# Patient Record
Sex: Male | Born: 1985 | Race: Black or African American | Hispanic: No | Marital: Single | Smoking: Former smoker
Health system: Southern US, Community
[De-identification: ages and names within clinical notes are randomized; demographics above are authoritative.]

## PROBLEM LIST (undated history)

## (undated) DIAGNOSIS — J45909 Unspecified asthma, uncomplicated: Secondary | ICD-10-CM

## (undated) HISTORY — PX: NO PAST SURGERIES: SHX2092

---

## 2019-02-14 ENCOUNTER — Other Ambulatory Visit: Payer: Self-pay | Admitting: *Deleted

## 2019-02-14 DIAGNOSIS — Z20822 Contact with and (suspected) exposure to covid-19: Secondary | ICD-10-CM

## 2019-02-20 ENCOUNTER — Telehealth: Payer: Self-pay | Admitting: General Practice

## 2019-02-20 LAB — NOVEL CORONAVIRUS, NAA: SARS-CoV-2, NAA: NOT DETECTED

## 2019-02-20 NOTE — Telephone Encounter (Signed)
Pt notified of Covid-19 results. Spoke with pt directly.

## 2019-06-29 ENCOUNTER — Encounter: Payer: Self-pay | Admitting: Emergency Medicine

## 2019-06-29 ENCOUNTER — Emergency Department: Payer: 59

## 2019-06-29 ENCOUNTER — Emergency Department
Admission: EM | Admit: 2019-06-29 | Discharge: 2019-06-30 | Disposition: A | Discharge status: Home / Self Care | Payer: 59 | Attending: Emergency Medicine | Admitting: Emergency Medicine

## 2019-06-29 ENCOUNTER — Other Ambulatory Visit: Payer: Self-pay

## 2019-06-29 DIAGNOSIS — R059 Cough, unspecified: Secondary | ICD-10-CM

## 2019-06-29 DIAGNOSIS — J40 Bronchitis, not specified as acute or chronic: Secondary | ICD-10-CM

## 2019-06-29 DIAGNOSIS — U071 COVID-19: Secondary | ICD-10-CM | POA: Insufficient documentation

## 2019-06-29 DIAGNOSIS — J45909 Unspecified asthma, uncomplicated: Secondary | ICD-10-CM | POA: Insufficient documentation

## 2019-06-29 DIAGNOSIS — R05 Cough: Secondary | ICD-10-CM

## 2019-06-29 DIAGNOSIS — R509 Fever, unspecified: Secondary | ICD-10-CM

## 2019-06-29 DIAGNOSIS — Z87891 Personal history of nicotine dependence: Secondary | ICD-10-CM | POA: Insufficient documentation

## 2019-06-29 DIAGNOSIS — Z20828 Contact with and (suspected) exposure to other viral communicable diseases: Secondary | ICD-10-CM | POA: Insufficient documentation

## 2019-06-29 HISTORY — DX: Unspecified asthma, uncomplicated: J45.909

## 2019-06-29 MED ORDER — BENZONATATE 100 MG PO CAPS: 100.0000 mg | ORAL_CAPSULE | Freq: Three times a day (TID) | ORAL | 0 refills | Status: DC | PRN

## 2019-06-29 MED ORDER — ALBUTEROL SULFATE HFA 108 (90 BASE) MCG/ACT IN AERS: 2.0000 | INHALATION_SPRAY | Freq: Four times a day (QID) | RESPIRATORY_TRACT | 0 refills | Status: DC | PRN

## 2019-06-29 MED ORDER — AZITHROMYCIN 500 MG PO TABS: 500.0000 mg | ORAL_TABLET | Freq: Every day | ORAL | 0 refills | Status: DC

## 2019-06-29 NOTE — ED Provider Notes (Signed)
[facility] Emergency Department Provider Note ________________   First MD Initiated Contact with Patient 06/29/19 2226     (approximate)  I have reviewed the triage vital signs and the nursing notes.   HISTORY  Chief Complaint Fever and Generalized Body Aches   HPI Austin Koch is a 33 y.o. male with history of asthma positive cigarette use daily presents to the emergency department secondary to fever generalized body aches productive cough x2 days.  Patient denies any known sick contact.  Patient denies any loss of taste or smell.  Patient denies any vomiting or diarrhea.       Past Medical History:  Diagnosis Date  . Asthma     There are no active problems to display for this patient.   History reviewed. No pertinent surgical history.  Prior to Admission medications   Not on File    Allergies Patient has no allergy information on record.  No family history on file.  Social History Social History   Tobacco Use  . Smoking status: Former Smoker  Substance Use Topics  . Alcohol use: Yes  . Drug use: Never    Review of Systems Constitutional: Positive for fever/chills Eyes: No visual changes. ENT: No sore throat. Cardiovascular: Denies chest pain. Respiratory: Positive for productive cough Gastrointestinal: No abdominal pain.  No nausea, no vomiting.  No diarrhea.  No constipation. Genitourinary: Negative for dysuria. Musculoskeletal: Negative for neck pain.  Negative for back pain. Integumentary: Negative for rash. Neurological: Negative for headaches, focal weakness or numbness.   ____________________________________________   PHYSICAL EXAM:  VITAL SIGNS: ED Triage Vitals  Enc Vitals Group     BP 06/29/19 2057 130/80     Pulse Rate 06/29/19 2057 95     Resp 06/29/19 2057 20     Temp 06/29/19 2057 99.9 F (37.7 C)     Temp Source 06/29/19 2057 Oral     SpO2 06/29/19 2057 97 %     Weight 06/29/19 2059 99.8 kg  (220 lb)     Height 06/29/19 2059 1.88 m (6\' 2" )     Head Circumference --      Peak Flow --      Pain Score 06/29/19 2059 0     Pain Loc --      Pain Edu? --      Excl. in [place]? --     Constitutional: Alert and oriented.  Eyes: Conjunctivae are normal.  Mouth/Throat: Patient is wearing a mask. Neck: No stridor.  No meningeal signs.   Cardiovascular: Normal rate, regular rhythm. Good peripheral circulation. Grossly normal heart sounds. Respiratory: Normal respiratory effort.  No retractions. Gastrointestinal: Soft and nontender. No distention.  Musculoskeletal: No lower extremity tenderness nor edema. No gross deformities of extremities. Neurologic:  Normal speech and language. No gross focal neurologic deficits are appreciated.  Skin:  Skin is warm, dry and intact. Psychiatric: Mood and affect are normal. Speech and behavior are normal.  ____________________________________________   LABS (all labs ordered are listed, but only abnormal results are displayed)  Labs Reviewed  SARS CORONAVIRUS 2 (TAT 6-24 HRS)  INFLUENZA PANEL BY PCR (TYPE A & B)   ______________ RADIOLOGY I, Aviston N BROWN, personally viewed and evaluated these images (plain radiographs) as part of my medical decision making, as well as reviewing the written report by the radiologist.  ED MD interpretation: No active disease per radiologist on chest x-ray  Official radiology report(s): Dg Chest 1 View  Result Date: 06/29/2019  CLINICAL DATA:  Cough and fever EXAM: CHEST  1 VIEW COMPARISON:  None. FINDINGS: The heart size and mediastinal contours are within normal limits. Both lungs are clear. The visualized skeletal structures are unremarkable. IMPRESSION: No active disease. Electronically Signed   By: Jasmine Pang M.D.   On: 06/29/2019 22:52     Procedures   ____________________________________________   INITIAL IMPRESSION / MDM / ASSESSMENT AND PLAN / ED COURSE  As part of my medical decision  making, I reviewed the following data within the electronic MEDICAL RECORD NUMBER  33 year old male presenting with above-stated history and physical exam concerning for possible COVID-19 infection versus bacterial pneumonia and bronchitis.  Chest x-ray revealed no evidence of pneumonia.  Spoke with the patient at length regarding the fact that COVID-19 testing will not be resulted for the next 6 to 24 hours and as such I advised the patient to self quarantine at home.  ____________________________________________  FINAL CLINICAL IMPRESSION(S) / ED DIAGNOSES  Viral illness.  MEDICATIONS GIVEN DURING THIS VISIT:  Medications - No data to display   ED Discharge Orders    None      *Please note:  [name] was evaluated in Emergency Department on 06/29/2019 for the symptoms described in the history of present illness. He was evaluated in the context of the global COVID-19 pandemic, which necessitated consideration that the patient might be at risk for infection with the SARS-CoV-2 virus that causes COVID-19. Institutional protocols and algorithms that pertain to the evaluation of patients at risk for COVID-19 are in a state of rapid change based on information released by regulatory bodies including the CDC and federal and state organizations. These policies and algorithms were followed during the patient's care in the ED.  Some ED evaluations and interventions may be delayed as a result of limited staffing during the pandemic.*  Note:  This document was prepared using Dragon voice recognition software and may include unintentional dictation errors.   Darci Current, MD 07/02/19 2151

## 2019-06-29 NOTE — ED Triage Notes (Signed)
Pt presents to ER from home with complaints of fever and body aches, productive cough sputum white in color, pt reports took ibuprofen prior to arrival fever at home 100.75F. Pt talks in complete sentences no distress noted

## 2019-06-30 ENCOUNTER — Telehealth: Payer: Self-pay | Admitting: Emergency Medicine

## 2019-06-30 LAB — SARS CORONAVIRUS 2 (TAT 6-24 HRS): SARS Coronavirus 2: POSITIVE — AB

## 2019-06-30 LAB — INFLUENZA PANEL BY PCR (TYPE A & B)
Influenza A By PCR: NEGATIVE
Influenza B By PCR: NEGATIVE

## 2019-06-30 MED ORDER — BENZONATATE 100 MG PO CAPS: 100.0000 mg | ORAL_CAPSULE | Freq: Three times a day (TID) | ORAL | 0 refills | Status: DC | PRN

## 2019-06-30 MED ORDER — AZITHROMYCIN 500 MG PO TABS: 500.0000 mg | ORAL_TABLET | Freq: Every day | ORAL | 0 refills | Status: AC

## 2019-06-30 MED ORDER — ALBUTEROL SULFATE HFA 108 (90 BASE) MCG/ACT IN AERS: 2.0000 | INHALATION_SPRAY | Freq: Four times a day (QID) | RESPIRATORY_TRACT | 0 refills | Status: DC | PRN

## 2019-06-30 NOTE — Telephone Encounter (Signed)
Called to assure patient is aware of positive covid test.  Left message with my  Number and that [facility] website has covid hotline for questions.  He does have mychart.

## 2019-07-01 ENCOUNTER — Telehealth: Payer: Self-pay | Admitting: *Deleted

## 2019-07-01 NOTE — Telephone Encounter (Signed)
Pt given result of COVID; he is having cough, muscle soreness; his fever has lowered; tier of symptoms reviewed; instructions given to pt: Marland Kitchen Instruct the patient to remain in self-quarantine until they meet the "Non-Test Criteria for Ending Self-Isolation". Non-Test Criteria for Ending Self-Isolation All persons with fever and respiratory symptoms should isolate themselves until ALL conditions listed below are met: - at least 10 days since symptoms onset - AND 3 consecutive days fever free without antipyretics (acetaminophen [Tylenol] or ibuprofen [Advil]) - AND improvement in respiratory symptoms . If the patient develops respiratory issues/distress, seek medical care in the Emergency Department, call 911, reports symptoms and report COVID-19 positive test. Patient Instructions . continue to utilize over-the-counter medications for fever (ibuprofen and/or Tylenol) and cough (cough medicine and/or sore throat lozenges). . wear a mask around people and follow good infection prevention techniques. . Patient to should only leave home to seek medical care. .  send family for food, prescriptions or medicines; or use delivery service.  . If the patient must leave the home, they must wear a mask in public. Marland Kitchen limit contact with immediate family members or caregivers in the home, and use mask, social distancing, and handwashing to decrease risk to patients. o Please continue good preventive care measures, including frequent hand washing, avoid touching your face, cover coughs/sneezes with tissue or into elbow, stay out of crowds and keep a 6-foot distance from others.   .  patient and family to clean hard surfaces touched by patient frequently with household cleaning products. Pt advised to notify his PCP; pt also informed the [facility] will be notified; he verbalized understanding; unable to chart in result note because no encounter created.

## 2019-07-05 ENCOUNTER — Encounter (HOSPITAL_COMMUNITY): Payer: Self-pay

## 2019-07-05 ENCOUNTER — Emergency Department: Payer: 59

## 2019-07-05 ENCOUNTER — Emergency Department
Admission: EM | Admit: 2019-07-05 | Discharge: 2019-07-05 | Disposition: A | Discharge status: Home / Self Care | Payer: 59 | Attending: Emergency Medicine | Admitting: Emergency Medicine

## 2019-07-05 ENCOUNTER — Inpatient Hospital Stay (HOSPITAL_COMMUNITY)
Admission: AD | Admit: 2019-07-05 | Discharge: 2019-07-10 | DRG: 177 | Disposition: A | Discharge status: Home / Self Care | Payer: 59 | Source: Other Acute Inpatient Hospital | Attending: Internal Medicine | Admitting: Internal Medicine

## 2019-07-05 ENCOUNTER — Other Ambulatory Visit: Payer: Self-pay

## 2019-07-05 DIAGNOSIS — E1169 Type 2 diabetes mellitus with other specified complication: Secondary | ICD-10-CM

## 2019-07-05 DIAGNOSIS — Z87891 Personal history of nicotine dependence: Secondary | ICD-10-CM | POA: Diagnosis not present

## 2019-07-05 DIAGNOSIS — U071 COVID-19: Secondary | ICD-10-CM | POA: Diagnosis present

## 2019-07-05 DIAGNOSIS — F1290 Cannabis use, unspecified, uncomplicated: Secondary | ICD-10-CM | POA: Diagnosis present

## 2019-07-05 DIAGNOSIS — Z23 Encounter for immunization: Secondary | ICD-10-CM | POA: Diagnosis not present

## 2019-07-05 DIAGNOSIS — J069 Acute upper respiratory infection, unspecified: Secondary | ICD-10-CM | POA: Diagnosis present

## 2019-07-05 DIAGNOSIS — R0603 Acute respiratory distress: Secondary | ICD-10-CM | POA: Diagnosis not present

## 2019-07-05 DIAGNOSIS — F17210 Nicotine dependence, cigarettes, uncomplicated: Secondary | ICD-10-CM | POA: Diagnosis present

## 2019-07-05 DIAGNOSIS — R0602 Shortness of breath: Secondary | ICD-10-CM | POA: Diagnosis present

## 2019-07-05 DIAGNOSIS — J45909 Unspecified asthma, uncomplicated: Secondary | ICD-10-CM | POA: Insufficient documentation

## 2019-07-05 DIAGNOSIS — Z794 Long term (current) use of insulin: Secondary | ICD-10-CM | POA: Diagnosis not present

## 2019-07-05 DIAGNOSIS — R0902 Hypoxemia: Secondary | ICD-10-CM | POA: Insufficient documentation

## 2019-07-05 DIAGNOSIS — Z79899 Other long term (current) drug therapy: Secondary | ICD-10-CM

## 2019-07-05 DIAGNOSIS — J9601 Acute respiratory failure with hypoxia: Secondary | ICD-10-CM | POA: Diagnosis present

## 2019-07-05 DIAGNOSIS — T380X5A Adverse effect of glucocorticoids and synthetic analogues, initial encounter: Secondary | ICD-10-CM | POA: Diagnosis not present

## 2019-07-05 DIAGNOSIS — J1282 Pneumonia due to coronavirus disease 2019: Secondary | ICD-10-CM

## 2019-07-05 DIAGNOSIS — J1289 Other viral pneumonia: Secondary | ICD-10-CM | POA: Diagnosis present

## 2019-07-05 DIAGNOSIS — J45901 Unspecified asthma with (acute) exacerbation: Secondary | ICD-10-CM

## 2019-07-05 DIAGNOSIS — R0600 Dyspnea, unspecified: Secondary | ICD-10-CM

## 2019-07-05 DIAGNOSIS — E1165 Type 2 diabetes mellitus with hyperglycemia: Secondary | ICD-10-CM | POA: Diagnosis present

## 2019-07-05 DIAGNOSIS — D649 Anemia, unspecified: Secondary | ICD-10-CM | POA: Diagnosis present

## 2019-07-05 LAB — SEDIMENTATION RATE: Sed Rate: 70 mm/hr — ABNORMAL HIGH (ref 0–15)

## 2019-07-05 LAB — BASIC METABOLIC PANEL
Anion gap: 14 (ref 5–15)
BUN: 11 mg/dL (ref 6–20)
CO2: 26 mmol/L (ref 22–32)
Calcium: 8.9 mg/dL (ref 8.9–10.3)
Chloride: 95 mmol/L — ABNORMAL LOW (ref 98–111)
Creatinine, Ser: 1.15 mg/dL (ref 0.61–1.24)
GFR calc Af Amer: >60 mL/min (ref >60)
GFR calc non Af Amer: >60 mL/min (ref >60)
Glucose, Bld: 224 mg/dL — ABNORMAL HIGH (ref 70–99)
Potassium: 3.9 mmol/L (ref 3.5–5.1)
Sodium: 135 mmol/L (ref 135–145)

## 2019-07-05 LAB — CBC
HCT: 42.2 % (ref 39.0–52.0)
Hemoglobin: 13.9 g/dL (ref 13.0–17.0)
MCH: 27.7 pg (ref 26.0–34.0)
MCHC: 32.9 g/dL (ref 30.0–36.0)
MCV: 84.1 fL (ref 80.0–100.0)
Platelets: 292 10*3/uL (ref 150–400)
RBC: 5.02 MIL/uL (ref 4.22–5.81)
RDW: 12.5 % (ref 11.5–15.5)
WBC: 7.2 10*3/uL (ref 4.0–10.5)
nRBC: 0 % (ref 0.0–0.2)

## 2019-07-05 LAB — FIBRIN DERIVATIVES D-DIMER (ARMC ONLY): Fibrin derivatives D-dimer (ARMC): 881.13 ng/mL (FEU) — ABNORMAL HIGH (ref 0.00–499.00)

## 2019-07-05 LAB — HEPATIC FUNCTION PANEL
ALT: 18 U/L (ref 0–44)
AST: 33 U/L (ref 15–41)
Albumin: 3.5 g/dL (ref 3.5–5.0)
Alkaline Phosphatase: 72 U/L (ref 38–126)
Bilirubin, Direct: 0.3 mg/dL — ABNORMAL HIGH (ref 0.0–0.2)
Indirect Bilirubin: 1 mg/dL — ABNORMAL HIGH (ref 0.3–0.9)
Total Bilirubin: 1.3 mg/dL — ABNORMAL HIGH (ref 0.3–1.2)
Total Protein: 7.9 g/dL (ref 6.5–8.1)

## 2019-07-05 LAB — C-REACTIVE PROTEIN: CRP: 18.7 mg/dL — ABNORMAL HIGH (ref <1.0)

## 2019-07-05 LAB — FIBRINOGEN: Fibrinogen: >750 mg/dL — ABNORMAL HIGH (ref 210–475)

## 2019-07-05 LAB — FERRITIN: Ferritin: 462 ng/mL — ABNORMAL HIGH (ref 24–336)

## 2019-07-05 MED ORDER — ONDANSETRON HCL 4 MG PO TABS: 4.0000 mg | ORAL_TABLET | Freq: Four times a day (QID) | ORAL | Status: DC | PRN

## 2019-07-05 MED ORDER — DEXAMETHASONE SODIUM PHOSPHATE 10 MG/ML IJ SOLN
10.0000 mg | Freq: Once | INTRAMUSCULAR | Status: AC
  Administered 2019-07-05: 10 mg via INTRAVENOUS
  Filled 2019-07-05: qty 1

## 2019-07-05 MED ORDER — SODIUM CHLORIDE 0.9 % IV SOLN
200.0000 mg | Freq: Once | INTRAVENOUS | Status: AC
  Administered 2019-07-05: 200 mg via INTRAVENOUS
  Filled 2019-07-05: qty 40

## 2019-07-05 MED ORDER — ALBUTEROL SULFATE HFA 108 (90 BASE) MCG/ACT IN AERS
2.0000 | INHALATION_SPRAY | Freq: Once | RESPIRATORY_TRACT | Status: DC
  Filled 2019-07-05: qty 6.7

## 2019-07-05 MED ORDER — HYDROCOD POLST-CPM POLST ER 10-8 MG/5ML PO SUER
5.0000 mL | Freq: Once | ORAL | Status: AC
  Administered 2019-07-05: 5 mL via ORAL
  Filled 2019-07-05: qty 5

## 2019-07-05 MED ORDER — ONDANSETRON HCL 4 MG/2ML IJ SOLN: 4.0000 mg | Freq: Four times a day (QID) | INTRAMUSCULAR | Status: DC | PRN

## 2019-07-05 MED ORDER — ACETAMINOPHEN 325 MG PO TABS
650.0000 mg | ORAL_TABLET | Freq: Four times a day (QID) | ORAL | Status: DC | PRN
  Administered 2019-07-05 – 2019-07-08 (×2): 650 mg via ORAL
  Filled 2019-07-05 (×2): qty 2

## 2019-07-05 MED ORDER — SODIUM CHLORIDE 0.9 % IV SOLN
100.0000 mg | Freq: Every day | INTRAVENOUS | Status: AC
  Administered 2019-07-06 – 2019-07-09 (×4): 100 mg via INTRAVENOUS
  Filled 2019-07-05 (×4): qty 100

## 2019-07-05 MED ORDER — HYDROCODONE-ACETAMINOPHEN 5-325 MG PO TABS
1.0000 | ORAL_TABLET | ORAL | Status: DC | PRN
  Administered 2019-07-07 – 2019-07-08 (×2): 2 via ORAL
  Filled 2019-07-05 (×2): qty 2

## 2019-07-05 MED ORDER — ENOXAPARIN SODIUM 40 MG/0.4ML ~~LOC~~ SOLN
40.0000 mg | SUBCUTANEOUS | Status: DC
  Administered 2019-07-05 – 2019-07-09 (×5): 40 mg via SUBCUTANEOUS
  Filled 2019-07-05 (×5): qty 0.4

## 2019-07-05 MED ORDER — IPRATROPIUM-ALBUTEROL 20-100 MCG/ACT IN AERS
1.0000 | INHALATION_SPRAY | Freq: Four times a day (QID) | RESPIRATORY_TRACT | Status: DC
  Administered 2019-07-05 – 2019-07-10 (×18): 1 via RESPIRATORY_TRACT
  Filled 2019-07-05: qty 4

## 2019-07-05 MED ORDER — TOCILIZUMAB 400 MG/20ML IV SOLN
800.0000 mg | Freq: Once | INTRAVENOUS | Status: AC
  Administered 2019-07-05: 23:00:00 800 mg via INTRAVENOUS
  Filled 2019-07-05: qty 40

## 2019-07-05 MED ORDER — HYDROCOD POLST-CPM POLST ER 10-8 MG/5ML PO SUER
5.0000 mL | Freq: Two times a day (BID) | ORAL | Status: DC | PRN
  Administered 2019-07-06: 5 mL via ORAL
  Filled 2019-07-05: qty 5

## 2019-07-05 MED ORDER — METHYLPREDNISOLONE SODIUM SUCC 40 MG IJ SOLR
40.0000 mg | Freq: Three times a day (TID) | INTRAMUSCULAR | Status: DC
  Administered 2019-07-05 – 2019-07-08 (×9): 40 mg via INTRAVENOUS
  Filled 2019-07-05 (×9): qty 1

## 2019-07-05 MED ORDER — GUAIFENESIN-DM 100-10 MG/5ML PO SYRP
10.0000 mL | ORAL_SOLUTION | ORAL | Status: DC | PRN
  Administered 2019-07-05 – 2019-07-08 (×2): 10 mL via ORAL
  Filled 2019-07-05 (×2): qty 10

## 2019-07-05 NOTE — Progress Notes (Addendum)
Pt was tx here from [facility] for [place]. He has hypoxia + LRTI. Baseline ALT wnl. He will also getting Actemra due to CRP>10.   Remdesivir 200mg  IV x1 then 100mg  IV q24 x4 F/u ALT Check hepatitis titers  Onnie Boer, PharmD, BCIDP, AAHIVP, CPP Infectious Disease Pharmacist 07/05/2019 7:04 PM

## 2019-07-05 NOTE — ED Notes (Signed)
Pt desatted to 89%. Informed Colletta [place], Therapist, sports. Will apply oxygen and request room.

## 2019-07-05 NOTE — ED Triage Notes (Signed)
Pt c/o increased SOB, has had a cough with SOB since last Sunday and was tested positive for covid on Wednesday. States he is not feeling any better and has a hx of asthma.

## 2019-07-05 NOTE — Progress Notes (Signed)
Pt. Arrived at this facility, doctor at bedside. Orders received. Pt. Stable. Will continuo to monitor

## 2019-07-05 NOTE — Progress Notes (Signed)
Patient deferred family update phone call. 

## 2019-07-05 NOTE — H&P (Signed)
History and Physical    Austin Koch [id number] DOB: 05/07/1986 DOA: 07/05/2019  PCP: Patient, No Pcp Per Patient coming [facility]: [facility]  Chief Complaint: Shortness of breath  HPI: Austin Koch is a 33 y.o. male with medical history significant of asthma [facility] for shortness of breath and hypoxia diagnosed with COVID-19.  Patient stated about a week ago he started experiencing fatigue, mild shortness of breath and fever.  He went to get tested for COVID-19 last Monday and was tested positive.  He was self quarantined at home and was being treated with supportive measures.  But over the course the last couple of days he has had more so progressive shortness of breath therefore went [facility].  In the ER he was noted to be hypoxic requiring 2-3 L nasal cannula.  Chest x-ray was suggestive of severe atypical pneumonia.  Inflammatory markers were elevated therefore he was started on Decadron.  [facility].   Review of Systems: As per HPI otherwise 10 point review of systems negative.  Review of Systems Otherwise negative except as per HPI, including: General: Denies night sweats or unintended weight loss. Resp: Denies hemoptysis Cardiac: Denies chest pain, palpitations, orthopnea, paroxysmal nocturnal dyspnea. GI: Denies abdominal pain, nausea, vomiting, diarrhea or constipation GU: Denies dysuria, frequency, hesitancy or incontinence MS: Denies muscle aches, joint pain or swelling Neuro: Denies headache, neurologic deficits (focal weakness, numbness, tingling), abnormal gait Psych: Denies anxiety, depression, SI/HI/AVH Skin: Denies new rashes or lesions ID: Denies sick contacts, exotic exposures, travel  Past Medical History:  Diagnosis Date  . Asthma     No past surgical history on file.  SOCIAL HISTORY: Smokes maybe 1-2 cigarettes/month.  Smokes marijuana very occasionally.  Drinks alcohol  maybe 2-3 times a month.  FAMILY HISTORY: Essential hypertension  Prior to Admission medications   Medication Sig Start Date End Date Taking? Authorizing Provider  albuterol (VENTOLIN HFA) 108 (90 Base) MCG/ACT inhaler Inhale 2 puffs into the lungs every 6 (six) hours as needed for wheezing or shortness of breath. 06/30/19  Yes Triplett, Cari B, FNP  benzonatate (TESSALON PERLES) 100 MG capsule Take 1 capsule (100 mg total) by mouth 3 (three) times daily as needed for cough. 06/30/19 06/29/20 Yes Triplett, Cari B, FNP    Physical Exam: Vitals:   07/05/19 1800  BP: 133/80  Pulse: 85  Resp: (!) 25  Temp: 100.2 F (37.9 C)  TempSrc: Oral  SpO2: (!) 89%      Constitutional: NAD, calm, comfortable, 4 L nasal cannula Eyes: PERRL, lids and conjunctivae normal ENMT: Mucous membranes are moist. Posterior pharynx clear of any exudate or lesions.Normal dentition.  Neck: normal, supple, no masses, no thyromegaly Respiratory: Scattered rhonchi Cardiovascular: Regular rate and rhythm, no murmurs / rubs / gallops. No extremity edema. 2+ pedal pulses. No carotid bruits.  Abdomen: no tenderness, no masses palpated. No hepatosplenomegaly. Bowel sounds positive.  Musculoskeletal: no clubbing / cyanosis. No joint deformity upper and lower extremities. Good ROM, no contractures. Normal muscle tone.  Skin: no rashes, lesions, ulcers. No induration Neurologic: CN 2-12 grossly intact. Sensation intact, DTR normal. Strength 5/5 in all 4.  Psychiatric: Normal judgment and insight. Alert and oriented x 3. Normal mood.     Labs on Admission: I have personally reviewed following labs and imaging studies  CBC: Recent Labs  Lab 07/05/19 1143  WBC 7.2  HGB 13.9  HCT 42.2  MCV 84.1  PLT 696   Basic Metabolic Panel: Recent Labs  Lab 07/05/19 1143  NA 135  K 3.9  CL 95*  CO2 26  GLUCOSE 224*  BUN 11  CREATININE 1.15  CALCIUM 8.9   GFR: Estimated Creatinine Clearance: 116.3 mL/min (by  C-G formula based on SCr of 1.15 mg/dL). Liver Function Tests: Recent Labs  Lab 07/05/19 1143  AST 33  ALT 18  ALKPHOS 72  BILITOT 1.3*  PROT 7.9  ALBUMIN 3.5   No results for input(s): LIPASE, AMYLASE in the last 168 hours. No results for input(s): AMMONIA in the last 168 hours. Coagulation Profile: No results for input(s): INR, PROTIME in the last 168 hours. Cardiac Enzymes: No results for input(s): CKTOTAL, CKMB, CKMBINDEX, TROPONINI in the last 168 hours. BNP (last 3 results) No results for input(s): PROBNP in the last 8760 hours. HbA1C: No results for input(s): HGBA1C in the last 72 hours. CBG: No results for input(s): GLUCAP in the last 168 hours. Lipid Profile: No results for input(s): CHOL, HDL, LDLCALC, TRIG, CHOLHDL, LDLDIRECT in the last 72 hours. Thyroid Function Tests: No results for input(s): TSH, T4TOTAL, FREET4, T3FREE, THYROIDAB in the last 72 hours. Anemia Panel: Recent Labs    07/05/19 1143  FERRITIN 462*   Urine analysis: No results found for: COLORURINE, APPEARANCEUR, LABSPEC, PHURINE, GLUCOSEU, HGBUR, BILIRUBINUR, KETONESUR, PROTEINUR, UROBILINOGEN, NITRITE, LEUKOCYTESUR Sepsis Labs: !!!!!!!!!!!!!!!!!!!!!!!!!!!!!!!!!!!!!!!!!!!! @LABRCNTIP (procalcitonin:4,lacticidven:4) ) Recent Results (from the past 240 hour(s))  SARS CORONAVIRUS 2 (TAT 6-24 HRS) Nasopharyngeal Nasopharyngeal Swab     Status: Abnormal   Collection Time: 06/29/19 10:50 PM   Specimen: Nasopharyngeal Swab  Result Value Ref Range Status   SARS Coronavirus 2 POSITIVE (A) NEGATIVE Final    Comment: RESULT CALLED TO, READ BACK BY AND VERIFIED WITH: Torrie Mayers RN 15:05 06/30/19 ([email or web address]) (NOTE) SARS-CoV-2 target nucleic acids are DETECTED. The SARS-CoV-2 RNA is generally detectable in upper and lower respiratory specimens during the acute phase of infection. Positive results are indicative of active infection with SARS-CoV-2. Clinical  correlation with patient history and other  diagnostic information is necessary to determine patient infection status. Positive results do  not rule out bacterial infection or co-infection with other viruses. The expected result is Negative. Fact Sheet for Patients: SugarRoll.be Fact Sheet for Healthcare Providers: https://www.woods-mathews.com/ This test is not yet approved or cleared by the [place] FDA and  has been authorized for detection and/or diagnosis of SARS-CoV-2 by FDA under an Emergency Use Authorization (EUA). This EUA will remain  in effect (meaning this test can be used) for  the duration of the COVID-19 declaration under Section 564(b)(1) of the Act, 21 U.S.C. section 360bbb-3(b)(1), unless the authorization is terminated or revoked sooner. Performed at [facility], Start [address], [place], [place] [address]      Radiological Exams on Admission: Dg Chest Portable 1 View  Result Date: 07/05/2019 CLINICAL DATA:  Shortness of breath, COVID positive. EXAM: PORTABLE CHEST 1 VIEW COMPARISON:  06/29/2019 FINDINGS: Lung volumes are diminished compared to prior study. Cardiomediastinal contours are normal and airways are patent. Increased interstitial and airspace opacities throughout both the left and right chest. This is a diffuse process but with relative sparing of left upper lobe. No acute bone finding. IMPRESSION: Findings that could be seen in a severe atypical or viral pneumonia. Electronically Signed   By: Zetta Bills M.D.   On: 07/05/2019 11:31     All images have been reviewed by me personally.    Assessment/Plan Principal Problem:  Acute respiratory disease due to COVID-19 virus Active Problems:   Asthma, chronic, unspecified asthma severity, with acute exacerbation    Acute respiratory distress secondary to COVID-19 Acute hypoxia, 82% on room air -Oxygen levels-currently on 3 L nasal cannula saturating greater than 90% -Remdesivir-day 1 -IV  Solu-Medrol 40 mg every 8 -Routine: Labs have been reviewed including ferritin, LDH, CRP, d-dimer, fibrinogen.  Will need to trend this lab daily.  CRP significantly elevated -Vitamin C & Zinc. Prone >16hrs/day.  -Procalcitonin and BNP ordered -Chest x-ray-suggestive of severe atypical pneumonia/infiltrate -Supportive care-antitussive, inhalers, I-S/flutter -CODE STATUS confirmed  Had extensive discussion with the patient regarding off label use of Actemra and emergent use of convalescent plasma given worsening of shortness of breath, oxygen requirement from hypoxia and elevation in inflammatory markers.  Denies any active/latent tuberculosis, hepatitis infection, active immunosuppressive state including chemotherapy.  No severe sepsis.  Patient and family understood and all the questions answered.  At this time patient agrees to proceed with Actemra but would like to hold off on plasma.  I have given him reading information for convalescent plasma and he will decide.  He understands that these medicines are most important during early in disease course.  Asthma, stable -Bronchodilators, incentive spirometer and flutter valve.  Supportive care.   DVT prophylaxis: Lovenox Code Status: Full Family Communication: None Disposition Plan: To be determined Consults called: None Admission status: Inpatient admission for hypoxia secondary to acute respiratory distress from COVID-19.   Time Spent: 65 minutes.  >50% of the time was devoted to discussing the patients care, assessment, plan and disposition with other care givers along with counseling the patient about the risks and benefits of treatment.    Shelonda Saxe Joline Maxcy MD [facility]  If 7PM-7AM, please contact night-coverage   07/05/2019, 6:44 PM

## 2019-07-05 NOTE — ED Provider Notes (Signed)
[facility] Emergency Department Provider Note  Time seen: 10:56 AM  I have reviewed the triage vital signs and the nursing notes.   HISTORY  Chief Complaint URI   HPI Austin Koch is a 33 y.o. male with a past medical history of asthma who presents to the emergency department for trouble breathing.  According to the patient for 1 week now he has had cough with fever at home, he tested positive for Covid on Wednesday.  Patient states he has continued to have cough and shortness of breath.  States he is no better today so he came to the emergency department for evaluation.  Patient states he has had intermittent fevers up until today denies any fever today, reassuring vitals with a borderline lower O2 saturation currently 92% on room air.  Patient is asthmatic uses albuterol at home.  This would be day 7-8 of symptoms.   Past Medical History:  Diagnosis Date  . Asthma     There are no active problems to display for this patient.   History reviewed. No pertinent surgical history.  Prior to Admission medications   Medication Sig Start Date End Date Taking? Authorizing Provider  albuterol (VENTOLIN HFA) 108 (90 Base) MCG/ACT inhaler Inhale 2 puffs into the lungs every 6 (six) hours as needed for wheezing or shortness of breath. 06/30/19   Triplett, Cari B, FNP  benzonatate (TESSALON PERLES) 100 MG capsule Take 1 capsule (100 mg total) by mouth 3 (three) times daily as needed for cough. 06/30/19 06/29/20  Triplett, Rulon Eisenmenger B, FNP    No Known Allergies  No family history on file.  Social History Social History   Tobacco Use  . Smoking status: Former Games developer  . Smokeless tobacco: Never Used  Substance Use Topics  . Alcohol use: Yes  . Drug use: Never    Review of Systems Constitutional: Intermittent fevers until yesterday, no fever since Cardiovascular: Mild chest discomfort worse with coughing Respiratory: Positive for shortness of breath.  Positive  for cough. Gastrointestinal: Negative for abdominal pain, vomiting Musculoskeletal: Negative for musculoskeletal complaints Neurological: Negative for headache All other ROS negative  ____________________________________________   PHYSICAL EXAM:  VITAL SIGNS: ED Triage Vitals  Enc Vitals Group     BP 07/05/19 1013 107/60     Pulse Rate 07/05/19 1013 81     Resp 07/05/19 1013 18     Temp 07/05/19 1013 98.7 F (37.1 C)     Temp Source 07/05/19 1013 Oral     SpO2 07/05/19 1013 94 %     Weight 07/05/19 1012 220 lb (99.8 kg)     Height 07/05/19 1012 6\' 2"  (1.88 m)     Head Circumference --      Peak Flow --      Pain Score 07/05/19 1012 0     Pain Loc --      Pain Edu? --      Excl. in [place]? --    Constitutional: Alert and oriented. Well appearing and in no distress. Eyes: Normal exam ENT      Head: Normocephalic and atraumatic.      Mouth/Throat: Mucous membranes are moist. Cardiovascular: Normal rate, regular rhythm. Respiratory: Normal respiratory effort mild rhonchi bilaterally no obvious wheeze. Gastrointestinal: Soft and nontender. No distention. Musculoskeletal: Nontender with normal range of motion in all extremities.  Neurologic:  Normal speech and language. No gross focal neurologic deficits  Skin:  Skin is warm, dry and intact.  Psychiatric: Mood and affect  are normal.   ____________________________________________   RADIOLOGY  Chest x-ray consistent with severe atypical infection  ____________________________________________   INITIAL IMPRESSION / ASSESSMENT AND PLAN / ED COURSE  Pertinent labs & imaging results that were available during my care of the patient were reviewed by me and considered in my medical decision making (see chart for details).   Patient presents emergency department for shortness of breath cough, Covid positive as of Wednesday symptoms x8 days.  We will obtain a chest x-ray, dose albuterol, Tussionex and continue to closely monitor.   Differential would include worsening Covid symptoms, pneumonia.  Patient D satting into the 80s as low as 85% on room air, for the most part stays between 87 and 90%.  I reviewed the patient's chest x-ray.  Show fairly significant bilateral patchy opacities consistent with COVID-19.  We will check labs including inflammatory markers treat with Decadron.  Given the patient's oxygen requirement we will discuss with [facility] for possible admission/transfer.  Patient accepted to [place] bed.  Patient agreeable to plan of care.  Austin Koch was evaluated in Emergency Department on 07/05/2019 for the symptoms described in the history of present illness. He was evaluated in the context of the global COVID-19 pandemic, which necessitated consideration that the patient might be at risk for infection with the SARS-CoV-2 virus that causes COVID-19. Institutional protocols and algorithms that pertain to the evaluation of patients at risk for COVID-19 are in a state of rapid change based on information released by regulatory bodies including the CDC and federal and state organizations. These policies and algorithms were followed during the patient's care in the ED.  ____________________________________________   FINAL CLINICAL IMPRESSION(S) / ED DIAGNOSES  Dyspnea COVID-19   Harvest Dark, MD 07/05/19 1313

## 2019-07-05 NOTE — ED Notes (Signed)
Pt's sat leveled dropped to 88% on room air. Pt placed on 2 L with sat increase to 94%.

## 2019-07-05 NOTE — ED Notes (Addendum)
Pt verbalized understanding of need for transport to [facility] for treatment. Signature pad not working at time of discharge pt is in agreement to transfer and has given verbal consent. NAD at this time.

## 2019-07-06 ENCOUNTER — Inpatient Hospital Stay (HOSPITAL_COMMUNITY): Payer: 59

## 2019-07-06 LAB — COMPREHENSIVE METABOLIC PANEL
ALT: 21 U/L (ref 0–44)
AST: 30 U/L (ref 15–41)
Albumin: 3.3 g/dL — ABNORMAL LOW (ref 3.5–5.0)
Alkaline Phosphatase: 79 U/L (ref 38–126)
Anion gap: 14 (ref 5–15)
BUN: 18 mg/dL (ref 6–20)
CO2: 23 mmol/L (ref 22–32)
Calcium: 8.8 mg/dL — ABNORMAL LOW (ref 8.9–10.3)
Chloride: 97 mmol/L — ABNORMAL LOW (ref 98–111)
Creatinine, Ser: 1.19 mg/dL (ref 0.61–1.24)
GFR calc Af Amer: >60 mL/min (ref >60)
GFR calc non Af Amer: >60 mL/min (ref >60)
Glucose, Bld: 348 mg/dL — ABNORMAL HIGH (ref 70–99)
Potassium: 4.5 mmol/L (ref 3.5–5.1)
Sodium: 134 mmol/L — ABNORMAL LOW (ref 135–145)
Total Bilirubin: 0.6 mg/dL (ref 0.3–1.2)
Total Protein: 7.7 g/dL (ref 6.5–8.1)

## 2019-07-06 LAB — CBC WITH DIFFERENTIAL/PLATELET
Abs Immature Granulocytes: 0.06 10*3/uL (ref 0.00–0.07)
Basophils Absolute: 0 10*3/uL (ref 0.0–0.1)
Basophils Relative: 0 %
Eosinophils Absolute: 0 10*3/uL (ref 0.0–0.5)
Eosinophils Relative: 0 %
HCT: 42.5 % (ref 39.0–52.0)
Hemoglobin: 13.7 g/dL (ref 13.0–17.0)
Immature Granulocytes: 1 %
Lymphocytes Relative: 10 %
Lymphs Abs: 0.6 10*3/uL — ABNORMAL LOW (ref 0.7–4.0)
MCH: 28 pg (ref 26.0–34.0)
MCHC: 32.2 g/dL (ref 30.0–36.0)
MCV: 86.7 fL (ref 80.0–100.0)
Monocytes Absolute: 0.1 10*3/uL (ref 0.1–1.0)
Monocytes Relative: 2 %
Neutro Abs: 5.3 10*3/uL (ref 1.7–7.7)
Neutrophils Relative %: 87 %
Platelets: 318 10*3/uL (ref 150–400)
RBC: 4.9 MIL/uL (ref 4.22–5.81)
RDW: 12.6 % (ref 11.5–15.5)
WBC: 6.1 10*3/uL (ref 4.0–10.5)
nRBC: 0 % (ref 0.0–0.2)

## 2019-07-06 LAB — HEMOGLOBIN A1C
Hgb A1c MFr Bld: 11.8 % — ABNORMAL HIGH (ref 4.8–5.6)
Mean Plasma Glucose: 291.96 mg/dL

## 2019-07-06 LAB — FERRITIN: Ferritin: 522 ng/mL — ABNORMAL HIGH (ref 24–336)

## 2019-07-06 LAB — GLUCOSE, CAPILLARY
Glucose-Capillary: 369 mg/dL — ABNORMAL HIGH (ref 70–99)
Glucose-Capillary: 360 mg/dL — ABNORMAL HIGH (ref 70–99)
Glucose-Capillary: 320 mg/dL — ABNORMAL HIGH (ref 70–99)

## 2019-07-06 LAB — C-REACTIVE PROTEIN: CRP: 17.8 mg/dL — ABNORMAL HIGH (ref <1.0)

## 2019-07-06 LAB — PHOSPHORUS: Phosphorus: 2.9 mg/dL (ref 2.5–4.6)

## 2019-07-06 LAB — ABO/RH: ABO/RH(D): O POS

## 2019-07-06 LAB — MAGNESIUM: Magnesium: 2.2 mg/dL (ref 1.7–2.4)

## 2019-07-06 LAB — D-DIMER, QUANTITATIVE: D-Dimer, Quant: 0.84 ug/mL-FEU — ABNORMAL HIGH (ref 0.00–0.50)

## 2019-07-06 MED ORDER — ZINC SULFATE 220 (50 ZN) MG PO CAPS
220.0000 mg | ORAL_CAPSULE | Freq: Every day | ORAL | Status: DC
  Administered 2019-07-06 – 2019-07-10 (×5): 220 mg via ORAL
  Filled 2019-07-06 (×5): qty 1

## 2019-07-06 MED ORDER — FUROSEMIDE 10 MG/ML IJ SOLN
40.0000 mg | Freq: Once | INTRAMUSCULAR | Status: AC
  Administered 2019-07-06: 12:00:00 40 mg via INTRAVENOUS
  Filled 2019-07-06: qty 4

## 2019-07-06 MED ORDER — INSULIN GLARGINE 100 UNIT/ML ~~LOC~~ SOLN
10.0000 [IU] | Freq: Two times a day (BID) | SUBCUTANEOUS | Status: DC
  Administered 2019-07-06 – 2019-07-07 (×2): 10 [IU] via SUBCUTANEOUS
  Filled 2019-07-06 (×3): qty 0.1

## 2019-07-06 MED ORDER — INSULIN ASPART 100 UNIT/ML ~~LOC~~ SOLN
0.0000 [IU] | Freq: Every day | SUBCUTANEOUS | Status: DC
  Administered 2019-07-06: 21:00:00 4 [IU] via SUBCUTANEOUS
  Administered 2019-07-07 – 2019-07-08 (×2): 3 [IU] via SUBCUTANEOUS
  Administered 2019-07-09: 21:00:00 2 [IU] via SUBCUTANEOUS

## 2019-07-06 MED ORDER — VITAMIN C 500 MG PO TABS
500.0000 mg | ORAL_TABLET | Freq: Every day | ORAL | Status: DC
  Administered 2019-07-06 – 2019-07-10 (×5): 500 mg via ORAL
  Filled 2019-07-06 (×5): qty 1

## 2019-07-06 MED ORDER — INSULIN ASPART 100 UNIT/ML ~~LOC~~ SOLN
0.0000 [IU] | Freq: Three times a day (TID) | SUBCUTANEOUS | Status: DC
  Administered 2019-07-06 – 2019-07-07 (×4): 15 [IU] via SUBCUTANEOUS
  Administered 2019-07-07: 8 [IU] via SUBCUTANEOUS
  Administered 2019-07-08 (×2): 11 [IU] via SUBCUTANEOUS
  Administered 2019-07-08: 8 [IU] via SUBCUTANEOUS
  Administered 2019-07-09: 2 [IU] via SUBCUTANEOUS
  Administered 2019-07-09: 12:00:00 5 [IU] via SUBCUTANEOUS
  Administered 2019-07-09: 09:00:00 8 [IU] via SUBCUTANEOUS
  Administered 2019-07-10: 08:00:00 3 [IU] via SUBCUTANEOUS
  Administered 2019-07-10: 13:00:00 2 [IU] via SUBCUTANEOUS

## 2019-07-06 MED ORDER — SODIUM CHLORIDE 0.9% IV SOLUTION
Freq: Once | INTRAVENOUS | Status: AC
  Administered 2019-07-06: 15:00:00 via INTRAVENOUS

## 2019-07-06 NOTE — Progress Notes (Signed)
Inpatient Diabetes Program Recommendations  AACE/ADA: New Consensus Statement on Inpatient Glycemic Control (2015)  Target Ranges:  Prepandial:   less than 140 mg/dL      Peak postprandial:   less than 180 mg/dL (1-2 hours)      Critically ill patients:  140 - 180 mg/dL   Lab Results  Component Value Date   GLUCAP 369 (H) 07/06/2019   HGBA1C 11.8 (H) 07/06/2019    Review of Glycemic Control Results for Austin Koch, Austin Koch (MRN [id number]) as of 07/06/2019 15:52  Ref. Range 07/06/2019 11:46  Glucose-Capillary Latest Ref Range: 70 - 99 mg/dL 369 (H)   Diabetes history: New onset? Outpatient Diabetes medications: none Current orders for Inpatient glycemic control: Novolog 0-15 units TID, Novolog 0-5 units QHS Solumedrol 40 mg Q8H  Inpatient Diabetes Program Recommendations:    Consider Levemir 10 units BID and changing diet to carb modified.   Noted A1C of 11.8% indicating new diabetes diagnosis per ADA guidelines.  Once noted in MD note, will begin education.   Thanks, Bronson Curb, MSN, RNC-OB Diabetes Coordinator [phone] (8a-5p)

## 2019-07-06 NOTE — Progress Notes (Signed)
Patient requested Austin Koch  [phone] be called for update. Update provided.

## 2019-07-06 NOTE — Progress Notes (Signed)
PROGRESS NOTE    Austin Koch  BJY:[id number] DOB: 11/20/1985 DOA: 07/05/2019 PCP: Patient, No Pcp Per   Brief Narrative:   33 y.o. male with medical history significant of asthma [facility] for shortness of breath and hypoxia diagnosed with COVID-19.  Started on steroids, remdesivir.  Due to elevated CRP, Actemra given.  Plasma given as well.   Assessment & Plan:   Principal Problem:   Acute respiratory disease due to COVID-19 virus Active Problems:   Asthma, chronic, unspecified asthma severity, with acute exacerbation   Acute respiratory distress secondary to COVID-19 -Oxygen levels-due to worsening of hypoxia, now on 14 L high flow -Remdesivir-day 2 -continues 40 mg Solu-Medrol every 8 hours -Daily inflammatory markers -Vitamin C & Zinc. Prone >16hrs/day.  -Procalcitonin and BNP ordered -Chest x-ray-suggestive of severe atypical pneumonia/infiltrate -Supportive care-antitussive, inhalers, I-S/flutter -CODE STATUS confirmed -Patient agreed for plasma today therefore will administer.  Consent signed in the chart -IV Lasix 40 mg  Had extensive discussion with the patient regarding off label use of Actemra and emergent use of convalescent plasma given worsening of shortness of breath, oxygen requirement from hypoxia and elevation in inflammatory markers.  Denies any active/latent tuberculosis, hepatitis infection, active immunosuppressive state including chemotherapy.  No severe sepsis.  Patient and family understood and all the questions answered.  Agreed to proceed with it.  Steroid-induced hyperglycemia -Insulin sliding scale and Accu-Cheks.  Asthma, stable -Bronchodilators, incentive spirometer and flutter valve.  Supportive care.    DVT prophylaxis: Lovenox Code Status: Full Family Communication: None Disposition Plan: Maintain hospital stay due to significant amount of hypoxia now on high flow.   Subjective: Feels okay but does have significant  amount of exertional dyspnea. Overnight his oxygen had to be increased as high as 14 L high flow  Review of Systems Otherwise negative except as per HPI, including: General: Denies fever, chills, night sweats or unintended weight loss. Resp: Denies hemoptysis Cardiac: Denies chest pain, palpitations, orthopnea, paroxysmal nocturnal dyspnea. GI: Denies abdominal pain, nausea, vomiting, diarrhea or constipation GU: Denies dysuria, frequency, hesitancy or incontinence MS: Denies muscle aches, joint pain or swelling Neuro: Denies headache, neurologic deficits (focal weakness, numbness, tingling), abnormal gait Psych: Denies anxiety, depression, SI/HI/AVH Skin: Denies new rashes or lesions ID: Denies sick contacts, exotic exposures, travel  Objective: Vitals:   07/06/19 0356 07/06/19 0540 07/06/19 0832 07/06/19 0927  BP:  109/69 118/78   Pulse: 72 68 80 82  Resp: (!) 22 (!) 26 (!) 25 (!) 24  Temp:  98.4 F (36.9 C)  97.9 F (36.6 C)  TempSrc:  Oral  Oral  SpO2: 91% 91% (!) 88% 90%  Weight:      Height:        Intake/Output Summary (Last 24 hours) at 07/06/2019 1029 Last data filed at 07/06/2019 0300 Gross per 24 hour  Intake 400 ml  Output 1000 ml  Net -600 ml   Filed Weights   07/05/19 1851  Weight: 99.8 kg    Examination:  General exam: Appears calm and comfortable, 14 L high flow Respiratory system: Diffuse bilateral rhonchi Cardiovascular system: S1 & S2 heard, RRR. No JVD, murmurs, rubs, gallops or clicks. No pedal edema. Gastrointestinal system: Abdomen is nondistended, soft and nontender. No organomegaly or masses felt. Normal bowel sounds heard. Central nervous system: Alert and oriented. No focal neurological deficits. Extremities: Symmetric 5 x 5 power. Skin: No rashes, lesions or ulcers Psychiatry: Judgement and insight appear normal. Mood & affect appropriate.  Data Reviewed:   CBC: Recent Labs  Lab 07/05/19 1143 07/06/19 0400  WBC 7.2 6.1   NEUTROABS  --  5.3  HGB 13.9 13.7  HCT 42.2 42.5  MCV 84.1 86.7  PLT 292 829   Basic Metabolic Panel: Recent Labs  Lab 07/05/19 1143 07/06/19 0400  NA 135 134*  K 3.9 4.5  CL 95* 97*  CO2 26 23  GLUCOSE 224* 348*  BUN 11 18  CREATININE 1.15 1.19  CALCIUM 8.9 8.8*  MG  --  2.2  PHOS  --  2.9   GFR: Estimated Creatinine Clearance: 112.4 mL/min (by C-G formula based on SCr of 1.19 mg/dL). Liver Function Tests: Recent Labs  Lab 07/05/19 1143 07/06/19 0400  AST 33 30  ALT 18 21  ALKPHOS 72 79  BILITOT 1.3* 0.6  PROT 7.9 7.7  ALBUMIN 3.5 3.3*   No results for input(s): LIPASE, AMYLASE in the last 168 hours. No results for input(s): AMMONIA in the last 168 hours. Coagulation Profile: No results for input(s): INR, PROTIME in the last 168 hours. Cardiac Enzymes: No results for input(s): CKTOTAL, CKMB, CKMBINDEX, TROPONINI in the last 168 hours. BNP (last 3 results) No results for input(s): PROBNP in the last 8760 hours. HbA1C: No results for input(s): HGBA1C in the last 72 hours. CBG: No results for input(s): GLUCAP in the last 168 hours. Lipid Profile: No results for input(s): CHOL, HDL, LDLCALC, TRIG, CHOLHDL, LDLDIRECT in the last 72 hours. Thyroid Function Tests: No results for input(s): TSH, T4TOTAL, FREET4, T3FREE, THYROIDAB in the last 72 hours. Anemia Panel: Recent Labs    07/05/19 1143 07/06/19 0400  FERRITIN 462* 522*   Sepsis Labs: No results for input(s): PROCALCITON, LATICACIDVEN in the last 168 hours.  Recent Results (from the past 240 hour(s))  SARS CORONAVIRUS 2 (TAT 6-24 HRS) Nasopharyngeal Nasopharyngeal Swab     Status: Abnormal   Collection Time: 06/29/19 10:50 PM   Specimen: Nasopharyngeal Swab  Result Value Ref Range Status   SARS Coronavirus 2 POSITIVE (A) NEGATIVE Final    Comment: RESULT CALLED TO, READ BACK BY AND VERIFIED WITH: Torrie Mayers RN 15:05 06/30/19 ([email or web address]) (NOTE) SARS-CoV-2 target nucleic acids are DETECTED. The  SARS-CoV-2 RNA is generally detectable in upper and lower respiratory specimens during the acute phase of infection. Positive results are indicative of active infection with SARS-CoV-2. Clinical  correlation with patient history and other diagnostic information is necessary to determine patient infection status. Positive results do  not rule out bacterial infection or co-infection with other viruses. The expected result is Negative. Fact Sheet for Patients: SugarRoll.be Fact Sheet for Healthcare Providers: https://www.woods-mathews.com/ This test is not yet approved or cleared by the [place] FDA and  has been authorized for detection and/or diagnosis of SARS-CoV-2 by FDA under an Emergency Use Authorization (EUA). This EUA will remain  in effect (meaning this test can be used) for  the duration of the COVID-19 declaration under Section 564(b)(1) of the Act, 21 U.S.C. section 360bbb-3(b)(1), unless the authorization is terminated or revoked sooner. Performed at [facility], [place] [address], [place], [place] [address]          Radiology Studies: Dg Chest [place] 1 View  Result Date: 07/06/2019 CLINICAL DATA:  33 year old male with a history of dyspnea EXAM: PORTABLE CHEST 1 VIEW COMPARISON:  07/05/2019 FINDINGS: Cardiomediastinal silhouette unchanged in size and contour. Low lung volumes persist. Persistent reticulonodular opacities of the lungs with improvement in aeration compared to the prior  plain film. No pneumothorax or pleural effusion. IMPRESSION: Persisting bilateral reticulonodular opacities, with improved aeration compared to the prior plain film. Electronically Signed   By: Gilmer Mor D.O.   On: 07/06/2019 08:31   Dg Chest Portable 1 View  Result Date: 07/05/2019 CLINICAL DATA:  Shortness of breath, COVID positive. EXAM: PORTABLE CHEST 1 VIEW COMPARISON:  06/29/2019 FINDINGS: Lung volumes are diminished compared to  prior study. Cardiomediastinal contours are normal and airways are patent. Increased interstitial and airspace opacities throughout both the left and right chest. This is a diffuse process but with relative sparing of left upper lobe. No acute bone finding. IMPRESSION: Findings that could be seen in a severe atypical or viral pneumonia. Electronically Signed   By: Donzetta Kohut M.D.   On: 07/05/2019 11:31        Scheduled Meds: . sodium chloride   Intravenous Once  . enoxaparin (LOVENOX) injection  40 mg Subcutaneous Q24H  . insulin aspart  0-15 Units Subcutaneous TID WC  . insulin aspart  0-5 Units Subcutaneous QHS  . Ipratropium-Albuterol  1 puff Inhalation Q6H  . methylPREDNISolone (SOLU-MEDROL) injection  40 mg Intravenous Q8H   Continuous Infusions: . remdesivir 100 mg in NS 250 mL       LOS: 1 day   Time spent= 40 mins    Miana Politte Joline Maxcy, MD [facility]  If 7PM-7AM, please contact night-coverage  07/06/2019, 10:29 AM

## 2019-07-07 LAB — BPAM FFP
Blood Product Expiration Date: 202011171336
ISSUE DATE / TIME: 202011161407
Unit Type and Rh: 5100

## 2019-07-07 LAB — PHOSPHORUS: Phosphorus: 4 mg/dL (ref 2.5–4.6)

## 2019-07-07 LAB — CBC WITH DIFFERENTIAL/PLATELET
Abs Immature Granulocytes: 0.09 10*3/uL — ABNORMAL HIGH (ref 0.00–0.07)
Basophils Absolute: 0 10*3/uL (ref 0.0–0.1)
Basophils Relative: 0 %
Eosinophils Absolute: 0 10*3/uL (ref 0.0–0.5)
Eosinophils Relative: 0 %
HCT: 44.2 % (ref 39.0–52.0)
Hemoglobin: 14.2 g/dL (ref 13.0–17.0)
Immature Granulocytes: 1 %
Lymphocytes Relative: 9 %
Lymphs Abs: 1 10*3/uL (ref 0.7–4.0)
MCH: 27.9 pg (ref 26.0–34.0)
MCHC: 32.1 g/dL (ref 30.0–36.0)
MCV: 86.8 fL (ref 80.0–100.0)
Monocytes Absolute: 0.4 10*3/uL (ref 0.1–1.0)
Monocytes Relative: 3 %
Neutro Abs: 9.8 10*3/uL — ABNORMAL HIGH (ref 1.7–7.7)
Neutrophils Relative %: 87 %
Platelets: 413 10*3/uL — ABNORMAL HIGH (ref 150–400)
RBC: 5.09 MIL/uL (ref 4.22–5.81)
RDW: 12.6 % (ref 11.5–15.5)
WBC: 11.2 10*3/uL — ABNORMAL HIGH (ref 4.0–10.5)
nRBC: 0 % (ref 0.0–0.2)

## 2019-07-07 LAB — GLUCOSE, CAPILLARY
Glucose-Capillary: 352 mg/dL — ABNORMAL HIGH (ref 70–99)
Glucose-Capillary: 385 mg/dL — ABNORMAL HIGH (ref 70–99)
Glucose-Capillary: 268 mg/dL — ABNORMAL HIGH (ref 70–99)
Glucose-Capillary: 290 mg/dL — ABNORMAL HIGH (ref 70–99)

## 2019-07-07 LAB — COMPREHENSIVE METABOLIC PANEL
ALT: 19 U/L (ref 0–44)
AST: 27 U/L (ref 15–41)
Albumin: 3.3 g/dL — ABNORMAL LOW (ref 3.5–5.0)
Alkaline Phosphatase: 77 U/L (ref 38–126)
Anion gap: 13 (ref 5–15)
BUN: 29 mg/dL — ABNORMAL HIGH (ref 6–20)
CO2: 26 mmol/L (ref 22–32)
Calcium: 9 mg/dL (ref 8.9–10.3)
Chloride: 98 mmol/L (ref 98–111)
Creatinine, Ser: 1.21 mg/dL (ref 0.61–1.24)
GFR calc Af Amer: >60 mL/min (ref >60)
GFR calc non Af Amer: >60 mL/min (ref >60)
Glucose, Bld: 330 mg/dL — ABNORMAL HIGH (ref 70–99)
Potassium: 4.9 mmol/L (ref 3.5–5.1)
Sodium: 137 mmol/L (ref 135–145)
Total Bilirubin: 0.6 mg/dL (ref 0.3–1.2)
Total Protein: 7.8 g/dL (ref 6.5–8.1)

## 2019-07-07 LAB — FERRITIN: Ferritin: 639 ng/mL — ABNORMAL HIGH (ref 24–336)

## 2019-07-07 LAB — MAGNESIUM: Magnesium: 2.5 mg/dL — ABNORMAL HIGH (ref 1.7–2.4)

## 2019-07-07 LAB — PREPARE FRESH FROZEN PLASMA: Unit division: 0

## 2019-07-07 LAB — HEPATITIS B SURFACE ANTIGEN: Hepatitis B Surface Ag: NEGATIVE — AB

## 2019-07-07 LAB — BRAIN NATRIURETIC PEPTIDE: B Natriuretic Peptide: 56.2 pg/mL (ref 0.0–100.0)

## 2019-07-07 LAB — C-REACTIVE PROTEIN: CRP: 6.8 mg/dL — ABNORMAL HIGH (ref <1.0)

## 2019-07-07 LAB — D-DIMER, QUANTITATIVE: D-Dimer, Quant: 0.92 ug/mL-FEU — ABNORMAL HIGH (ref 0.00–0.50)

## 2019-07-07 LAB — HEPATITIS B SURFACE ANTIBODY, QUANTITATIVE: Hep B S AB Quant (Post): 6.4 m[IU]/mL — ABNORMAL LOW (ref >9.9)

## 2019-07-07 LAB — HIV ANTIBODY (ROUTINE TESTING W REFLEX): HIV Screen 4th Generation wRfx: NONREACTIVE — AB

## 2019-07-07 MED ORDER — LIVING WELL WITH DIABETES BOOK
Freq: Once | Status: AC
  Administered 2019-07-07: 21:00:00
  Filled 2019-07-07: qty 1

## 2019-07-07 MED ORDER — SALINE SPRAY 0.65 % NA SOLN
1.0000 | NASAL | Status: DC | PRN
  Filled 2019-07-07 (×2): qty 44

## 2019-07-07 MED ORDER — INSULIN GLARGINE 100 UNIT/ML ~~LOC~~ SOLN
15.0000 [IU] | Freq: Two times a day (BID) | SUBCUTANEOUS | Status: DC
  Administered 2019-07-07 – 2019-07-08 (×2): 15 [IU] via SUBCUTANEOUS
  Filled 2019-07-07 (×3): qty 0.15

## 2019-07-07 MED ORDER — CALCIUM CARBONATE ANTACID 500 MG PO CHEW
1.0000 | CHEWABLE_TABLET | Freq: Two times a day (BID) | ORAL | Status: DC | PRN
  Administered 2019-07-07: 19:00:00 200 mg via ORAL
  Filled 2019-07-07: qty 1

## 2019-07-07 MED ORDER — INSULIN ASPART 100 UNIT/ML ~~LOC~~ SOLN
4.0000 [IU] | Freq: Three times a day (TID) | SUBCUTANEOUS | Status: DC
  Administered 2019-07-07 – 2019-07-10 (×9): 4 [IU] via SUBCUTANEOUS

## 2019-07-07 NOTE — Progress Notes (Signed)
PROGRESS NOTE    Austin Koch  LFY:[id number] DOB: 1986/08/15 DOA: 07/05/2019 PCP: Patient, No Pcp Per   Brief Narrative:   33 y.o. male with medical history significant of asthma transferred from [facility] for shortness of breath and hypoxia diagnosed with COVID-19.  Started on steroids, remdesivir.  Status post Actemra and plasma.   Assessment & Plan:   Principal Problem:   Acute respiratory disease due to COVID-19 virus Active Problems:   Asthma, chronic, unspecified asthma severity, with acute exacerbation   Acute respiratory distress secondary to COVID-19 Acute hypoxia, remains on 14 L high flow -Oxygen levels-remains on 14 L high flow -Remdesivir-day 3 -Solu-Medrol 40 mg every 8 hours -Daily inflammatory markers -Vitamin C & Zinc. Prone >16hrs/day.  -Chest x-ray-suggestive of severe atypical pneumonia/infiltrate -Supportive care-antitussive, inhalers, I-S/flutter -CODE STATUS confirmed -Plasma and Actemra given  Steroid-induced hyperglycemia New onset diabetes mellitus, insulin-dependent.  Hemoglobin A1c 11.8 -Insulin sliding scale and Accu-Cheks. -Lantus 10 units twice daily.  Carb modified diet  Asthma, stable -Bronchodilators, incentive spirometer and flutter valve.  Supportive care.    DVT prophylaxis: Lovenox Code Status: Full Family Communication: None Disposition Plan: Maintain hospital stay as he is significantly hypoxic requiring IV remdesivir and steroids.  Unsafe for discharge   Subjective: Still hypoxic requiring 14 L of high flow nasal cannula.  At rest he feels okay but desaturates with movement  Review of Systems Otherwise negative except as per HPI, including: General = no fevers, chills, dizziness, malaise, fatigue HEENT/EYES = negative for pain, redness, loss of vision, double vision, blurred vision, loss of hearing, sore throat, hoarseness, dysphagia Cardiovascular= negative for chest pain, palpitation, murmurs, lower extremity swelling  Respiratory/lungs= negative for  hemoptysis Gastrointestinal= negative for nausea, vomiting,, abdominal pain, melena, hematemesis Genitourinary= negative for Dysuria, Hematuria, Change in Urinary Frequency MSK = Negative for arthralgia, myalgias, Back Pain, Joint swelling  Neurology= Negative for headache, seizures, numbness, tingling  Psychiatry= Negative for anxiety, depression, suicidal and homocidal ideation Allergy/Immunology= Medication/Food allergy as listed  Skin= Negative for Rash, lesions, ulcers, itching   Objective: Vitals:   07/06/19 1622 07/06/19 1950 07/06/19 2016 07/07/19 0422  BP: 118/73     Pulse: 79     Resp: (!) 24  (!) 24   Temp: 98.7 F (37.1 C)  98.9 F (37.2 C) 98.2 F (36.8 C)  TempSrc: Oral   Oral  SpO2: 90% (!) 89% (!) 89%   Weight:      Height:        Intake/Output Summary (Last 24 hours) at 07/07/2019 0737 Last data filed at 07/07/2019 0600 Gross per 24 hour  Intake 1851 ml  Output -  Net 1851 ml   Filed Weights   07/05/19 1851  Weight: 99.8 kg    Examination: Constitutional: Not in acute distress, 14 L high flow Respiratory: Bilateral rhonchi diffusely Cardiovascular: Normal sinus rhythm, no rubs Abdomen: Nontender nondistended good bowel sounds Musculoskeletal: No edema noted Skin: No rashes seen Neurologic: CN 2-12 grossly intact.  And nonfocal Psychiatric: Normal judgment and insight. Alert and oriented x 3. Normal mood.     Data Reviewed:   CBC: Recent Labs  Lab 07/05/19 1143 07/06/19 0400  WBC 7.2 6.1  NEUTROABS  --  5.3  HGB 13.9 13.7  HCT 42.2 42.5  MCV 84.1 86.7  PLT 292 318   Basic Metabolic Panel: Recent Labs  Lab 07/05/19 1143 07/06/19 0400  NA 135 134*  K 3.9 4.5  CL 95* 97*  CO2 26 23  GLUCOSE  224* 348*  BUN 11 18  CREATININE 1.15 1.19  CALCIUM 8.9 8.8*  MG  --  2.2  PHOS  --  2.9   GFR: Estimated Creatinine Clearance: 112.4 mL/min (by C-G formula based on SCr of 1.19 mg/dL). Liver Function  Tests: Recent Labs  Lab 07/05/19 1143 07/06/19 0400  AST 33 30  ALT 18 21  ALKPHOS 72 79  BILITOT 1.3* 0.6  PROT 7.9 7.7  ALBUMIN 3.5 3.3*   No results for input(s): LIPASE, AMYLASE in the last 168 hours. No results for input(s): AMMONIA in the last 168 hours. Coagulation Profile: No results for input(s): INR, PROTIME in the last 168 hours. Cardiac Enzymes: No results for input(s): CKTOTAL, CKMB, CKMBINDEX, TROPONINI in the last 168 hours. BNP (last 3 results) No results for input(s): PROBNP in the last 8760 hours. HbA1C: Recent Labs    07/06/19 0400  HGBA1C 11.8*   CBG: Recent Labs  Lab 07/06/19 1146 07/06/19 1624 07/06/19 2015  GLUCAP 369* 360* 320*   Lipid Profile: No results for input(s): CHOL, HDL, LDLCALC, TRIG, CHOLHDL, LDLDIRECT in the last 72 hours. Thyroid Function Tests: No results for input(s): TSH, T4TOTAL, FREET4, T3FREE, THYROIDAB in the last 72 hours. Anemia Panel: Recent Labs    07/05/19 1143 07/06/19 0400  FERRITIN 462* 522*   Sepsis Labs: No results for input(s): PROCALCITON, LATICACIDVEN in the last 168 hours.  Recent Results (from the past 240 hour(s))  SARS CORONAVIRUS 2 (TAT 6-24 HRS) Nasopharyngeal Nasopharyngeal Swab     Status: Abnormal   Collection Time: 06/29/19 10:50 PM   Specimen: Nasopharyngeal Swab  Result Value Ref Range Status   SARS Coronavirus 2 POSITIVE (A) NEGATIVE Final    Comment: RESULT CALLED TO, READ BACK BY AND VERIFIED WITH: Marlyn CorporalH. Fisher RN 15:05 06/30/19 ([email or web address]) (NOTE) SARS-CoV-2 target nucleic acids are DETECTED. The SARS-CoV-2 RNA is generally detectable in upper and lower respiratory specimens during the acute phase of infection. Positive results are indicative of active infection with SARS-CoV-2. Clinical  correlation with patient history and other diagnostic information is necessary to determine patient infection status. Positive results do  not rule out bacterial infection or co-infection with other  viruses. The expected result is Negative. Fact Sheet for Patients: HairSlick.nohttps://www.fda.gov/media/138098/download Fact Sheet for Healthcare Providers: quierodirigir.comhttps://www.fda.gov/media/138095/download This test is not yet approved or cleared by the [place] FDA and  has been authorized for detection and/or diagnosis of SARS-CoV-2 by FDA under an Emergency Use Authorization (EUA). This EUA will remain  in effect (meaning this test can be used) for  the duration of the COVID-19 declaration under Section 564(b)(1) of the Act, 21 U.S.C. section 360bbb-3(b)(1), unless the authorization is terminated or revoked sooner. Performed at [facility], [address] [place], [place] [address]          Radiology Studies: Dg Chest [place] View  Result Date: 07/06/2019 CLINICAL DATA:  33 year old male with a history of dyspnea EXAM: PORTABLE CHEST 1 VIEW COMPARISON:  07/05/2019 FINDINGS: Cardiomediastinal silhouette unchanged in size and contour. Low lung volumes persist. Persistent reticulonodular opacities of the lungs with improvement in aeration compared to the prior plain film. No pneumothorax or pleural effusion. IMPRESSION: Persisting bilateral reticulonodular opacities, with improved aeration compared to the prior plain film. Electronically Signed   By: Gilmer MorJaime  Wagner D.O.   On: 07/06/2019 08:31   Dg Chest Portable 1 View  Result Date: 07/05/2019 CLINICAL DATA:  Shortness of breath, COVID positive. EXAM: PORTABLE CHEST 1 VIEW COMPARISON:  06/29/2019 FINDINGS: Lung volumes are diminished compared to prior study. Cardiomediastinal contours are normal and airways are patent. Increased interstitial and airspace opacities throughout both the left and right chest. This is a diffuse process but with relative sparing of left upper lobe. No acute bone finding. IMPRESSION: Findings that could be seen in a severe atypical or viral pneumonia. Electronically Signed   By: Zetta Bills M.D.   On: 07/05/2019  11:31        Scheduled Meds: . enoxaparin (LOVENOX) injection  40 mg Subcutaneous Q24H  . insulin aspart  0-15 Units Subcutaneous TID WC  . insulin aspart  0-5 Units Subcutaneous QHS  . insulin glargine  10 Units Subcutaneous BID  . Ipratropium-Albuterol  1 puff Inhalation Q6H  . methylPREDNISolone (SOLU-MEDROL) injection  40 mg Intravenous Q8H  . vitamin C  500 mg Oral Daily  . zinc sulfate  220 mg Oral Daily   Continuous Infusions: . remdesivir 100 mg in NS 250 mL 100 mg (07/06/19 1645)     LOS: 2 days   Time spent= 40 mins    Myelle Poteat Arsenio Loader, MD [facility]  If 7PM-7AM, please contact night-coverage  07/07/2019, 7:37 AM

## 2019-07-07 NOTE — Progress Notes (Signed)
Results for [name], [name] (MRN [id number]) as of 07/07/2019 11:36  Ref. Range 07/06/2019 11:46 07/06/2019 16:24 07/06/2019 20:15 07/07/2019 08:01  Glucose-Capillary Latest Ref Range: 70 - 99 mg/dL 369 (H) 360 (H) 320 (H) 352 (H)  Noted that blood sugars continue to be greater than 300 mg/dl.  Recommend increasing Lantus to 15 units BID, increase Novolog to RESISTANT correction scale TID & HS, and add Novolog 4 units TID with meals if patient eats at least 50% of meal.  Will have staff RN's begin talking with patient about new onset diabetes. Will follow patient closely for glucose control and will follow up with diabetes education.   Harvel Ricks RN BSN CDE Diabetes Coordinator Pager: [phone]  8am-5pm

## 2019-07-07 NOTE — Care Plan (Signed)
Diabetic education to be initiated, diabetic coordinator consulted. 3rd dose of IV remdesivir given today. Hi flow [place] fio2 decreased to 13 liters from 14 as pulse saturation improved to 95%. Prone and right side position most of day, no acute respiratory distress noted from pt.

## 2019-07-08 DIAGNOSIS — J45901 Unspecified asthma with (acute) exacerbation: Secondary | ICD-10-CM

## 2019-07-08 DIAGNOSIS — R0603 Acute respiratory distress: Secondary | ICD-10-CM

## 2019-07-08 DIAGNOSIS — E1169 Type 2 diabetes mellitus with other specified complication: Secondary | ICD-10-CM

## 2019-07-08 DIAGNOSIS — J1289 Other viral pneumonia: Secondary | ICD-10-CM

## 2019-07-08 LAB — CBC WITH DIFFERENTIAL/PLATELET
Abs Immature Granulocytes: 0.08 10*3/uL — ABNORMAL HIGH (ref 0.00–0.07)
Basophils Absolute: 0 10*3/uL (ref 0.0–0.1)
Basophils Relative: 0 %
Eosinophils Absolute: 0 10*3/uL (ref 0.0–0.5)
Eosinophils Relative: 0 %
HCT: 44.9 % (ref 39.0–52.0)
Hemoglobin: 14.4 g/dL (ref 13.0–17.0)
Immature Granulocytes: 1 %
Lymphocytes Relative: 8 %
Lymphs Abs: 1 10*3/uL (ref 0.7–4.0)
MCH: 28.1 pg (ref 26.0–34.0)
MCHC: 32.1 g/dL (ref 30.0–36.0)
MCV: 87.7 fL (ref 80.0–100.0)
Monocytes Absolute: 0.6 10*3/uL (ref 0.1–1.0)
Monocytes Relative: 5 %
Neutro Abs: 10.7 10*3/uL — ABNORMAL HIGH (ref 1.7–7.7)
Neutrophils Relative %: 86 %
Platelets: 444 10*3/uL — ABNORMAL HIGH (ref 150–400)
RBC: 5.12 MIL/uL (ref 4.22–5.81)
RDW: 12.6 % (ref 11.5–15.5)
WBC: 12.4 10*3/uL — ABNORMAL HIGH (ref 4.0–10.5)
nRBC: 0 % (ref 0.0–0.2)

## 2019-07-08 LAB — COMPREHENSIVE METABOLIC PANEL
ALT: 18 U/L (ref 0–44)
AST: 23 U/L (ref 15–41)
Albumin: 3.3 g/dL — ABNORMAL LOW (ref 3.5–5.0)
Alkaline Phosphatase: 78 U/L (ref 38–126)
Anion gap: 14 (ref 5–15)
BUN: 28 mg/dL — ABNORMAL HIGH (ref 6–20)
CO2: 27 mmol/L (ref 22–32)
Calcium: 9.3 mg/dL (ref 8.9–10.3)
Chloride: 96 mmol/L — ABNORMAL LOW (ref 98–111)
Creatinine, Ser: 1.1 mg/dL (ref 0.61–1.24)
GFR calc Af Amer: >60 mL/min (ref >60)
GFR calc non Af Amer: >60 mL/min (ref >60)
Glucose, Bld: 286 mg/dL — ABNORMAL HIGH (ref 70–99)
Potassium: 5.3 mmol/L — ABNORMAL HIGH (ref 3.5–5.1)
Sodium: 137 mmol/L (ref 135–145)
Total Bilirubin: 0.6 mg/dL (ref 0.3–1.2)
Total Protein: 7.5 g/dL (ref 6.5–8.1)

## 2019-07-08 LAB — MAGNESIUM: Magnesium: 2.5 mg/dL — ABNORMAL HIGH (ref 1.7–2.4)

## 2019-07-08 LAB — GLUCOSE, CAPILLARY
Glucose-Capillary: 314 mg/dL — ABNORMAL HIGH (ref 70–99)
Glucose-Capillary: 306 mg/dL — ABNORMAL HIGH (ref 70–99)
Glucose-Capillary: 260 mg/dL — ABNORMAL HIGH (ref 70–99)
Glucose-Capillary: 277 mg/dL — ABNORMAL HIGH (ref 70–99)

## 2019-07-08 LAB — D-DIMER, QUANTITATIVE: D-Dimer, Quant: 0.79 ug/mL-FEU — ABNORMAL HIGH (ref 0.00–0.50)

## 2019-07-08 LAB — PHOSPHORUS: Phosphorus: 4.4 mg/dL (ref 2.5–4.6)

## 2019-07-08 LAB — FERRITIN: Ferritin: 602 ng/mL — ABNORMAL HIGH (ref 24–336)

## 2019-07-08 LAB — C-REACTIVE PROTEIN: CRP: 2.5 mg/dL — ABNORMAL HIGH (ref <1.0)

## 2019-07-08 MED ORDER — INSULIN GLARGINE 100 UNIT/ML ~~LOC~~ SOLN
20.0000 [IU] | Freq: Two times a day (BID) | SUBCUTANEOUS | Status: DC
  Administered 2019-07-08 – 2019-07-10 (×4): 20 [IU] via SUBCUTANEOUS
  Filled 2019-07-08 (×6): qty 0.2

## 2019-07-08 MED ORDER — DEXAMETHASONE SODIUM PHOSPHATE 10 MG/ML IJ SOLN
6.0000 mg | INTRAMUSCULAR | Status: DC
  Administered 2019-07-08 – 2019-07-09 (×2): 6 mg via INTRAVENOUS
  Filled 2019-07-08 (×2): qty 1

## 2019-07-08 NOTE — Progress Notes (Signed)
Results for VERL, KITSON (MRN [id number]) as of 07/08/2019 14:17  Ref. Range 07/07/2019 12:21 07/07/2019 16:01 07/07/2019 21:08 07/08/2019 08:28 07/08/2019 11:23  Glucose-Capillary Latest Ref Range: 70 - 99 mg/dL 385 (H) 268 (H) 290 (H) 314 (H) 306 (H)  Noted that CBGs continue to be greater than 200 mg/dl.  Recommend increasing Lantus to 20 units BID, increase Novolog correction scale to RESISTANT TID & HS, and continue Novolog 4 units TID.   Harvel Ricks RN BSN CDE Diabetes Coordinator Pager: [phone]  8am-5pm

## 2019-07-08 NOTE — Plan of Care (Signed)
  Problem: Education: Goal: Knowledge of risk factors and measures for prevention of condition will improve Outcome: Progressing   Problem: Coping: Goal: Psychosocial and spiritual needs will be supported Outcome: Progressing   Problem: Respiratory: Goal: Will maintain a patent airway Outcome: Progressing Goal: Complications related to the disease process, condition or treatment will be avoided or minimized Outcome: Progressing   

## 2019-07-08 NOTE — Progress Notes (Signed)
   07/08/19 1616  Family/Significant Other Communication  Family/Significant Other Update Called;Updated (Friend Kia per patient request)  Patient request Kia be first contact.

## 2019-07-08 NOTE — Progress Notes (Addendum)
PROGRESS NOTE    Yida Hyams  EXH:[id number] DOB: 09-06-1985 DOA: 07/05/2019 PCP: Patient, No Pcp Per    Brief Narrative:  33 year old male who presented with dyspnea to [facility]  He does have significant past medical history for asthma.  Patient reported 7 days of fatigue, dyspnea and fever.  Patient tested positive for COVID-19 6 days prior to hospitalization.  On his initial physical examination he was hypoxic, he tested positive for COVID-19 and transferred to [facility], by the time of his transfer his blood pressure was 133/80, pulse rate 85, respiratory 25, temperature 100.2 F, oximetry 89%, moist mucous membranes, lungs with rhonchi bilaterally, heart S1-S2 present rhythmic, abdomen soft, no lower extremity edema. Sodium 135, potassium 3.9, chloride 95, bicarb 26, glucose 224, BUN 11, creatinine 1.15, white count 7.2, hemoglobin 13.9, hematocrit 42.2, platelets 292.  Chest radiograph with multifocal interstitial infiltrates, bibasilar and right upper lobe.   Patient was admitted to the hospital with a working diagnosis of acute hypoxic respiratory failure due to SARS COVID-19 viral pneumonia  Patient has been receiving remdesivir and systemic corticosteroids, supplemental oxygen per high flow nasal cannula, 14 L/min. His follow-up chest radiograph with persistent interstitial infiltrates, predominantly right upper lobe.  Due to severe disease and persistent hypoxemia he received convalescent plasma and Actemra.  Assessment & Plan:   Principal Problem:   Acute respiratory disease due to COVID-19 virus Active Problems:   Asthma, chronic, unspecified asthma severity, with acute exacerbation   1.  Acute hypoxic respiratory failure due to SARS COVID-19 viral pneumonia.  RR: 14  Pulse oxymetry: 91%  Fi02: 13 LPM per HFNC  COVID-19 Labs  Recent Labs    07/06/19 0400 07/07/19 0415 07/08/19 0533  DDIMER 0.84* 0.92* 0.79*  FERRITIN 522* 639* 602*  CRP 17.8* 6.8* 2.5*     Lab Results  Component Value Date   SARSCOV2NAA POSITIVE (A) 06/29/2019   [place] Not Detected 02/14/2019   Inflammatory markers are trending down.   Continue medical therapy with Remdesivir and systemic corticosteroids. Patient has received convalescent plasma and actemra. Asked him to lay in left lateral decubitus and prone, as tolerated. Infiltrate is predominately on the right upper lobe on last imaging. Will continue antitussive therapy, bronchodilators and airway clearing techniques with flutter valve and incentive spirometer.   Patient continue to be at a high risk for worsening respiratory failure.   2. T2DM with steroid induced hyperglycemia. Fasting glucose is 286 with capillary 3145 and 306. Continue uncontrolled hyperglycemia, will continue glucose cover and monitoring with insulin sliding scale and basal insulin. Premall insulin 4 units. Will increase basal insulin to 20 units bid for now.   3, Chronic anemia. Cell count has been stable. Will continue close follow up.    DVT prophylaxis: enoxaparin   Code Status: full Family Communication: no family at the bedside  Disposition Plan/ discharge barriers: pending clinical improvement.   Body mass index is 28.23 kg/m. Malnutrition Type:      Malnutrition Characteristics:      Nutrition Interventions:     RN Pressure Injury Documentation:     Consultants:     Procedures:     Antimicrobials:       Subjective: Patient continue to have significant dyspnea, with exertion he has not been out of bed yet.   Objective: Vitals:   07/07/19 1725 07/07/19 1913 07/08/19 0307 07/08/19 0843  BP:    105/63  Pulse: 66   63  Resp: 17   14  Temp:  99 F (37.2 C) 98.4 F (36.9 C) 98.4 F (36.9 C)  TempSrc:  Oral  Oral  SpO2: 92% 91% 90% 91%  Weight:      Height:       No intake or output data in the 24 hours ending 07/08/19 1021 Filed Weights   07/05/19 1851  Weight: 99.8 kg    Examination:    General: Not in pain or dyspnea. Deconditioned  Neurology: Awake and alert, non focal  E ENT: no pallor, no icterus, oral mucosa moist Cardiovascular: No JVD. S1-S2 present, rhythmic, no gallops, rubs, or murmurs. No lower extremity edema. Pulmonary: positive reath sounds bilaterally. Gastrointestinal. Abdomen with no organomegaly, non tender, no rebound or guarding Skin. No rashes Musculoskeletal: no joint deformities     Data Reviewed: I have personally reviewed following labs and imaging studies  CBC: Recent Labs  Lab 07/05/19 1143 07/06/19 0400 07/07/19 0415 07/08/19 0533  WBC 7.2 6.1 11.2* 12.4*  NEUTROABS  --  5.3 9.8* 10.7*  HGB 13.9 13.7 14.2 14.4  HCT 42.2 42.5 44.2 44.9  MCV 84.1 86.7 86.8 87.7  PLT 292 318 413* 444*   Basic Metabolic Panel: Recent Labs  Lab 07/05/19 1143 07/06/19 0400 07/07/19 0415 07/08/19 0533  NA 135 134* 137 137  K 3.9 4.5 4.9 5.3*  CL 95* 97* 98 96*  CO2 26 23 26 27   GLUCOSE 224* 348* 330* 286*  BUN 11 18 29* 28*  CREATININE 1.15 1.19 1.21 1.10  CALCIUM 8.9 8.8* 9.0 9.3  MG  --  2.2 2.5* 2.5*  PHOS  --  2.9 4.0 4.4   GFR: Estimated Creatinine Clearance: 121.6 mL/min (by C-G formula based on SCr of 1.1 mg/dL). Liver Function Tests: Recent Labs  Lab 07/05/19 1143 07/06/19 0400 07/07/19 0415 07/08/19 0533  AST 33 30 27 23   ALT 18 21 19 18   ALKPHOS 72 79 77 78  BILITOT 1.3* 0.6 0.6 0.6  PROT 7.9 7.7 7.8 7.5  ALBUMIN 3.5 3.3* 3.3* 3.3*   No results for input(s): LIPASE, AMYLASE in the last 168 hours. No results for input(s): AMMONIA in the last 168 hours. Coagulation Profile: No results for input(s): INR, PROTIME in the last 168 hours. Cardiac Enzymes: No results for input(s): CKTOTAL, CKMB, CKMBINDEX, TROPONINI in the last 168 hours. BNP (last 3 results) No results for input(s): PROBNP in the last 8760 hours. HbA1C: Recent Labs    07/06/19 0400  HGBA1C 11.8*   CBG: Recent Labs  Lab 07/07/19 0801 07/07/19  1221 07/07/19 1601 07/07/19 2108 07/08/19 0828  GLUCAP 352* 385* 268* 290* 314*   Lipid Profile: No results for input(s): CHOL, HDL, LDLCALC, TRIG, CHOLHDL, LDLDIRECT in the last 72 hours. Thyroid Function Tests: No results for input(s): TSH, T4TOTAL, FREET4, T3FREE, THYROIDAB in the last 72 hours. Anemia Panel: Recent Labs    07/07/19 0415 07/08/19 0533  FERRITIN 639* 602*      Radiology Studies: I have reviewed all of the imaging during this hospital visit personally     Scheduled Meds: . enoxaparin (LOVENOX) injection  40 mg Subcutaneous Q24H  . insulin aspart  0-15 Units Subcutaneous TID WC  . insulin aspart  0-5 Units Subcutaneous QHS  . insulin aspart  4 Units Subcutaneous TID WC  . insulin glargine  15 Units Subcutaneous BID  . Ipratropium-Albuterol  1 puff Inhalation Q6H  . methylPREDNISolone (SOLU-MEDROL) injection  40 mg Intravenous Q8H  . vitamin C  500 mg Oral Daily  . zinc sulfate  220 mg Oral Daily   Continuous Infusions: . remdesivir 100 mg in NS 250 mL 100 mg (07/08/19 0926)     LOS: 3 days        Joeanthony Seeling Annett Gula, MD

## 2019-07-08 NOTE — TOC Initial Note (Signed)
Transition of Care [facility]) - Initial/Assessment Note    Patient Details  Name: Austin Koch MRN: [id number] Date of Birth: 1985-10-10  Transition of Care [facility]) CM/SW Contact:    Ninfa Meeker, RN Phone Number: [phone] (working remotely) 07/08/2019, 3:16 PM  Clinical Narrative:   Patient is a 33 y.o.malewith medical history significant ofasthma , went to [facility] ED for shortness of breath and hypoxia, was diagnosed with COVID-19. Patient was transferred to [facility] for further treatment, On Remdesivir until 11/20, on IV steroids. Independent prior to admission. Case manager will continue to monitor.      Expected Discharge Plan and Services                                                Prior Living Arrangements/Services                       Activities of Daily Living Home Assistive Devices/Equipment: None ADL Screening (condition at time of admission) Patient's cognitive ability adequate to safely complete daily activities?: Yes Is the patient deaf or have difficulty hearing?: No Does the patient have difficulty seeing, even when wearing glasses/contacts?: No Does the patient have difficulty concentrating, remembering, or making decisions?: No Patient able to express need for assistance with ADLs?: No Does the patient have difficulty dressing or bathing?: No Independently performs ADLs?: Yes (appropriate for developmental age) Does the patient have difficulty walking or climbing stairs?: No Weakness of Legs: None Weakness of Arms/Hands: None  Permission Sought/Granted                  Emotional Assessment              Admission diagnosis:  COVID 19 UPPER RESPIRATORY INFECTION Patient Active Problem List   Diagnosis Date Noted  . Acute respiratory disease due to COVID-19 virus 07/05/2019  . Asthma, chronic, unspecified asthma severity, with acute exacerbation 07/05/2019   PCP:  Patient, No Pcp Per Pharmacy:    [id number] [address], [place] [address] Phone: [phone] Fax: [phone]     Social Determinants of Health (SDOH) Interventions    Readmission Risk Interventions No flowsheet data found.

## 2019-07-09 DIAGNOSIS — D649 Anemia, unspecified: Secondary | ICD-10-CM

## 2019-07-09 LAB — COMPREHENSIVE METABOLIC PANEL
ALT: 15 U/L (ref 0–44)
AST: 17 U/L (ref 15–41)
Albumin: 3.4 g/dL — ABNORMAL LOW (ref 3.5–5.0)
Alkaline Phosphatase: 67 U/L (ref 38–126)
Anion gap: 12 (ref 5–15)
BUN: 30 mg/dL — ABNORMAL HIGH (ref 6–20)
CO2: 25 mmol/L (ref 22–32)
Calcium: 8.6 mg/dL — ABNORMAL LOW (ref 8.9–10.3)
Chloride: 94 mmol/L — ABNORMAL LOW (ref 98–111)
Creatinine, Ser: 1.17 mg/dL (ref 0.61–1.24)
GFR calc Af Amer: >60 mL/min (ref >60)
GFR calc non Af Amer: >60 mL/min (ref >60)
Glucose, Bld: 299 mg/dL — ABNORMAL HIGH (ref 70–99)
Potassium: 4.3 mmol/L (ref 3.5–5.1)
Sodium: 131 mmol/L — ABNORMAL LOW (ref 135–145)
Total Bilirubin: 1.1 mg/dL (ref 0.3–1.2)
Total Protein: 7.2 g/dL (ref 6.5–8.1)

## 2019-07-09 LAB — GLUCOSE, CAPILLARY
Glucose-Capillary: 297 mg/dL — ABNORMAL HIGH (ref 70–99)
Glucose-Capillary: 228 mg/dL — ABNORMAL HIGH (ref 70–99)
Glucose-Capillary: 148 mg/dL — ABNORMAL HIGH (ref 70–99)

## 2019-07-09 LAB — FERRITIN: Ferritin: 510 ng/mL — ABNORMAL HIGH (ref 24–336)

## 2019-07-09 LAB — C-REACTIVE PROTEIN: CRP: 1 mg/dL — ABNORMAL HIGH (ref <1.0)

## 2019-07-09 LAB — D-DIMER, QUANTITATIVE: D-Dimer, Quant: 0.93 ug/mL-FEU — ABNORMAL HIGH (ref 0.00–0.50)

## 2019-07-09 MED ORDER — PNEUMOCOCCAL VAC POLYVALENT 25 MCG/0.5ML IJ INJ
0.5000 mL | INJECTION | INTRAMUSCULAR | Status: AC
  Administered 2019-07-10: 08:00:00 0.5 mL via INTRAMUSCULAR
  Filled 2019-07-09: qty 0.5

## 2019-07-09 MED ORDER — INFLUENZA VAC SPLIT QUAD 0.5 ML IM SUSY
0.5000 mL | PREFILLED_SYRINGE | INTRAMUSCULAR | Status: AC
  Administered 2019-07-10: 08:00:00 0.5 mL via INTRAMUSCULAR
  Filled 2019-07-09: qty 0.5

## 2019-07-09 NOTE — Progress Notes (Signed)
PROGRESS NOTE    Austin Koch  ZOX:[id number] DOB: 01/06/1986 DOA: 07/05/2019 PCP: Patient, No Pcp Per    Brief Narrative:  33 year old male who presented with dyspnea to [facility]  He does have significant past medical history for asthma.  Patient reported 7 days of fatigue, dyspnea and fever.  Patient tested positive for COVID-19 6 days prior to hospitalization.  On his initial physical examination he was hypoxic, he tested positive for COVID-19 and [facility], by the time of his transfer his blood pressure was 133/80, pulse rate 85, respiratory 25, temperature 100.2 F, oximetry 89%, moist mucous membranes, lungs with rhonchi bilaterally, heart S1-S2 present rhythmic, abdomen soft, no lower extremity edema. Sodium 135, potassium 3.9, chloride 95, bicarb 26, glucose 224, BUN 11, creatinine 1.15, white count 7.2, hemoglobin 13.9, hematocrit 42.2, platelets 292.  Chest radiograph with multifocal interstitial infiltrates, bibasilar and right upper lobe.   Patient was admitted to the hospital with a working diagnosis of acute hypoxic respiratory failure due to SARS COVID-19 viral pneumonia  Patient has been receiving remdesivir and systemic corticosteroids, supplemental oxygen per high flow nasal cannula, 14 L/min. His follow-up chest radiograph with persistent interstitial infiltrates, predominantly right upper lobe.  Due to severe disease and persistent hypoxemia he received convalescent plasma and Actemra 07/06/19.   Patient slowly improving with decreasing oxygen requirements. Continue to be very weak and deconditioned. Infiltrate is predominately on the right upper lobe, instructed for prone and left lateral decubitus.   Assessment & Plan:   Principal Problem:   Acute respiratory disease due to COVID-19 virus Active Problems:   Asthma, chronic, unspecified asthma severity, with acute exacerbation   1.  Acute hypoxic respiratory failure due to SARS COVID-19 viral  pneumonia. Sp convalescent plasma and actemra 07/06/19.   RR: 20  Pulse oxymetry: 93%  Fi02: 21% room air.  COVID-19 Labs  Recent Labs    07/07/19 0415 07/08/19 0533 07/09/19 0255  DDIMER 0.92* 0.79* 0.93*  FERRITIN 639* 602* 510*  CRP 6.8* 2.5* 1.0*    Lab Results  Component Value Date   SARSCOV2NAA POSITIVE (A) 06/29/2019   SARSCOV2NAA Not Detected 02/14/2019    Continue to trend down inflammatory markers.    He completed today Remdesivir #5/5, continue with systemic corticosteroids. Continue left lateral decubitus and prone position as tolerated. Continue antitussive therapy, bronchodilators and airway clearing techniques with flutter valve and incentive spirometer. Continue zinc and vitamin C.   If continue to improve oxygenation, possible dc home in am, will need a ambulatory oxymetry on room air before dc, he may need home 02.   Will get physical therapy evaluation.   2. T2DM with steroid induced hyperglycemia. Fasting glucose is 299 with capillary 297 and 228. Basal dose has been increased to 20 units bid, continue with insulin sliding scale resistant and pre-meal coverage with insulin 4 units.  3, Chronic anemia. Stable cell counts.   DVT prophylaxis: enoxaparin   Code Status: full Family Communication: no family at the bedside  Disposition Plan/ discharge barriers: pending clinical improvement. Possible dc in am.     Body mass index is 28.23 kg/m. Malnutrition Type:      Malnutrition Characteristics:      Nutrition Interventions:     RN Pressure Injury Documentation:     Consultants:      Procedures:     Antimicrobials:       Subjective: Patient is feeling better this am, now on room air. Continue to be very weak  and deconditioned, no nausea or vomiting. No chest pain. Continue to have dyspnea with minimal efforts.   Objective: Vitals:   07/09/19 0039 07/09/19 0552 07/09/19 0553 07/09/19 0810  BP: 117/67  110/63 118/65   Pulse: 70 73 70 63  Resp: 19 18 20 20   Temp: 98.2 F (36.8 C) 97.7 F (36.5 C)  99.2 F (37.3 C)  TempSrc:  Oral  Oral  SpO2: 97% 97% 100% 93%  Weight:      Height:        Intake/Output Summary (Last 24 hours) at 07/09/2019 0848 Last data filed at 07/08/2019 1800 Gross per 24 hour  Intake 1364.26 ml  Output -  Net 1364.26 ml   Filed Weights   07/05/19 1851  Weight: 99.8 kg    Examination:   General: Not in pain or dyspnea, deconditioned  Neurology: Awake and alert, non focal  E ENT: no pallor, no icterus, oral mucosa moist Cardiovascular: No JVD. S1-S2 present, rhythmic, no gallops, rubs, or murmurs. No lower extremity edema. Pulmonary: positive breath sounds bilaterally. Gastrointestinal. Abdomen with no organomegaly, non tender, no rebound or guarding Skin. No rashes Musculoskeletal: no joint deformities     Data Reviewed: I have personally reviewed following labs and imaging studies  CBC: Recent Labs  Lab 07/05/19 1143 07/06/19 0400 07/07/19 0415 07/08/19 0533  WBC 7.2 6.1 11.2* 12.4*  NEUTROABS  --  5.3 9.8* 10.7*  HGB 13.9 13.7 14.2 14.4  HCT 42.2 42.5 44.2 44.9  MCV 84.1 86.7 86.8 87.7  PLT 292 318 413* 161*   Basic Metabolic Panel: Recent Labs  Lab 07/05/19 1143 07/06/19 0400 07/07/19 0415 07/08/19 0533 07/09/19 0255  NA 135 134* 137 137 131*  K 3.9 4.5 4.9 5.3* 4.3  CL 95* 97* 98 96* 94*  CO2 26 23 26 27 25   GLUCOSE 224* 348* 330* 286* 299*  BUN 11 18 29* 28* 30*  CREATININE 1.15 1.19 1.21 1.10 1.17  CALCIUM 8.9 8.8* 9.0 9.3 8.6*  MG  --  2.2 2.5* 2.5*  --   PHOS  --  2.9 4.0 4.4  --    GFR: Estimated Creatinine Clearance: 114.4 mL/min (by C-G formula based on SCr of 1.17 mg/dL). Liver Function Tests: Recent Labs  Lab 07/05/19 1143 07/06/19 0400 07/07/19 0415 07/08/19 0533 07/09/19 0255  AST 33 30 27 23 17   ALT 18 21 19 18 15   ALKPHOS 72 79 77 78 67  BILITOT 1.3* 0.6 0.6 0.6 1.1  PROT 7.9 7.7 7.8 7.5 7.2  ALBUMIN 3.5  3.3* 3.3* 3.3* 3.4*   No results for input(s): LIPASE, AMYLASE in the last 168 hours. No results for input(s): AMMONIA in the last 168 hours. Coagulation Profile: No results for input(s): INR, PROTIME in the last 168 hours. Cardiac Enzymes: No results for input(s): CKTOTAL, CKMB, CKMBINDEX, TROPONINI in the last 168 hours. BNP (last 3 results) No results for input(s): PROBNP in the last 8760 hours. HbA1C: No results for input(s): HGBA1C in the last 72 hours. CBG: Recent Labs  Lab 07/08/19 0828 07/08/19 1123 07/08/19 1624 07/08/19 2000 07/09/19 0749  GLUCAP 314* 306* 260* 277* 297*   Lipid Profile: No results for input(s): CHOL, HDL, LDLCALC, TRIG, CHOLHDL, LDLDIRECT in the last 72 hours. Thyroid Function Tests: No results for input(s): TSH, T4TOTAL, FREET4, T3FREE, THYROIDAB in the last 72 hours. Anemia Panel: Recent Labs    07/08/19 0533 07/09/19 0255  FERRITIN 602* 510*      Radiology Studies: I have reviewed  all of the imaging during this hospital visit personally     Scheduled Meds: . dexamethasone (DECADRON) injection  6 mg Intravenous Q24H  . enoxaparin (LOVENOX) injection  40 mg Subcutaneous Q24H  . insulin aspart  0-15 Units Subcutaneous TID WC  . insulin aspart  0-5 Units Subcutaneous QHS  . insulin aspart  4 Units Subcutaneous TID WC  . insulin glargine  20 Units Subcutaneous BID  . Ipratropium-Albuterol  1 puff Inhalation Q6H  . vitamin C  500 mg Oral Daily  . zinc sulfate  220 mg Oral Daily   Continuous Infusions: . remdesivir 100 mg in NS 250 mL Stopped (07/08/19 1012)     LOS: 4 days        Gershom Brobeck Annett Gula, MD

## 2019-07-09 NOTE — Plan of Care (Signed)
  Problem: Coping: Goal: Psychosocial and spiritual needs will be supported Outcome: Not Progressing  Increased anxiety. Emotional support provided.  Problem: Activity: Goal: Risk for activity intolerance will decrease Outcome: Not Progressing  Desats with activity. FV and IS encouraged.   Problem: Nutrition: Goal: Adequate nutrition will be maintained Outcome: Not Progressing  Diminished appetite. PO encouraged.

## 2019-07-09 NOTE — Progress Notes (Signed)
   07/09/19 1415  Family/Significant Other Communication  Family/Significant Other Update Updated (Spoke with friend Kia)

## 2019-07-09 NOTE — Plan of Care (Signed)
Every time pt ambulated in room, he would get anxious d/t how is breathing would worsening (become more SOB) while ambulating.  Once activity stopped and given to recovery, pt did better but takes about 2-5 minutes to get pt calm and breathing slower and deeper.  Sats drop down to 85 -86 with activity on RA and when resting up to 97% on RA.    Problem: Activity: Goal: Risk for activity intolerance will decrease Outcome: Not Progressing   Problem: Coping: Goal: Level of anxiety will decrease Outcome: Not Progressing

## 2019-07-09 NOTE — Progress Notes (Signed)
Spoke with patient contact Kia, while in the room with patient. Answered all questions and concerns she had at this time and discussed precautions and discharge planning for patient.

## 2019-07-09 NOTE — Progress Notes (Signed)
   07/09/19 1156  Family/Significant Other Communication  Family/Significant Other Update Called (Call to friend Kia, no answer)

## 2019-07-10 DIAGNOSIS — E1169 Type 2 diabetes mellitus with other specified complication: Secondary | ICD-10-CM

## 2019-07-10 DIAGNOSIS — J1282 Pneumonia due to coronavirus disease 2019: Secondary | ICD-10-CM

## 2019-07-10 DIAGNOSIS — Z794 Long term (current) use of insulin: Secondary | ICD-10-CM

## 2019-07-10 DIAGNOSIS — U071 COVID-19: Secondary | ICD-10-CM

## 2019-07-10 LAB — COMPREHENSIVE METABOLIC PANEL
ALT: 16 U/L (ref 0–44)
AST: 21 U/L (ref 15–41)
Albumin: 3.3 g/dL — ABNORMAL LOW (ref 3.5–5.0)
Alkaline Phosphatase: 63 U/L (ref 38–126)
Anion gap: 13 (ref 5–15)
BUN: 29 mg/dL — ABNORMAL HIGH (ref 6–20)
CO2: 26 mmol/L (ref 22–32)
Calcium: 9 mg/dL (ref 8.9–10.3)
Chloride: 96 mmol/L — ABNORMAL LOW (ref 98–111)
Creatinine, Ser: 1.21 mg/dL (ref 0.61–1.24)
GFR calc Af Amer: >60 mL/min (ref >60)
GFR calc non Af Amer: >60 mL/min (ref >60)
Glucose, Bld: 246 mg/dL — ABNORMAL HIGH (ref 70–99)
Potassium: 4.6 mmol/L (ref 3.5–5.1)
Sodium: 135 mmol/L (ref 135–145)
Total Bilirubin: 1.1 mg/dL (ref 0.3–1.2)
Total Protein: 7 g/dL (ref 6.5–8.1)

## 2019-07-10 LAB — GLUCOSE, CAPILLARY
Glucose-Capillary: 236 mg/dL — ABNORMAL HIGH (ref 70–99)
Glucose-Capillary: 171 mg/dL — ABNORMAL HIGH (ref 70–99)
Glucose-Capillary: 191 mg/dL — ABNORMAL HIGH (ref 70–99)
Glucose-Capillary: 135 mg/dL — ABNORMAL HIGH (ref 70–99)

## 2019-07-10 LAB — C-REACTIVE PROTEIN: CRP: <0.8 mg/dL (ref <1.0)

## 2019-07-10 LAB — FERRITIN: Ferritin: 592 ng/mL — ABNORMAL HIGH (ref 24–336)

## 2019-07-10 LAB — D-DIMER, QUANTITATIVE: D-Dimer, Quant: 1.48 ug/mL-FEU — ABNORMAL HIGH (ref 0.00–0.50)

## 2019-07-10 MED ORDER — GUAIFENESIN-DM 100-10 MG/5ML PO SYRP: 10.0000 mL | ORAL_SOLUTION | ORAL | 0 refills | Status: DC | PRN

## 2019-07-10 MED ORDER — ACETAMINOPHEN 325 MG PO TABS: 650.0000 mg | ORAL_TABLET | Freq: Four times a day (QID) | ORAL | 0 refills | Status: DC | PRN

## 2019-07-10 MED ORDER — BLOOD GLUCOSE METER KIT: PACK | 0 refills | Status: AC

## 2019-07-10 MED ORDER — ALBUTEROL SULFATE HFA 108 (90 BASE) MCG/ACT IN AERS: 2.0000 | INHALATION_SPRAY | Freq: Four times a day (QID) | RESPIRATORY_TRACT | 0 refills | Status: DC | PRN

## 2019-07-10 MED ORDER — METFORMIN HCL 500 MG PO TABS
500.0000 mg | ORAL_TABLET | Freq: Two times a day (BID) | ORAL | Status: DC
  Administered 2019-07-10: 10:00:00 500 mg via ORAL
  Filled 2019-07-10: qty 1

## 2019-07-10 MED ORDER — METFORMIN HCL 500 MG PO TABS: 500.0000 mg | ORAL_TABLET | Freq: Two times a day (BID) | ORAL | 0 refills | Status: DC

## 2019-07-10 NOTE — Discharge Instructions (Signed)
°Carbohydrate Counting For People With Diabetes ° °Why Is Carbohydrate Counting Important? °• Counting carbohydrate servings may help you control your blood glucose level so that you feel better.  °• The balance between the carbohydrates you eat and insulin determines what your blood glucose level will be after eating.  °• Carbohydrate counting can also help you plan your meals. °Which Foods Have Carbohydrates? °Foods with carbohydrates include: °• Breads, crackers, and cereals  °• Pasta, rice, and grains  °• Starchy vegetables, such as potatoes, corn, and peas  °• Beans and legumes  °• Milk, soy milk, and yogurt  °• Fruits and fruit juices  °• Sweets, such as cakes, cookies, ice cream, jam, and jelly °Carbohydrate Servings °In diabetes meal planning, 1 serving of a food with carbohydrate has about 15 grams of carbohydrate: °• Check serving sizes with measuring cups and spoons or a food scale.  °• Read the Nutrition Facts on food labels to find out how many grams of carbohydrate are in foods you eat. °The food lists in this handout show portions that have about 15 grams of carbohydrate. ° °Tips °Meal Planning Tips °• An Eating Plan tells you how many carbohydrate servings to eat at your meals and snacks. For many adults, eating 3 to 5 servings of carbohydrate foods at each meal and 1 or 2 carbohydrate servings for each snack works well.  °• In a healthy daily Eating Plan, most carbohydrates come from:  °• At least 6 servings of fruits and nonstarchy vegetables  °• At least 6 servings of grains, beans, and starchy vegetables, with at least 3 servings from whole grains  °• At least 2 servings of milk or milk products °• Check your blood glucose level regularly. It can tell you if you need to adjust when you eat carbohydrates.  °• Eating foods that have fiber, such as whole grains, and having very few salty foods is good for your health.  °• Eat 4 to 6 ounces of meat or other protein foods (such as soybean burgers)  each day. Choose low-fat sources of protein, such as lean beef, lean pork, chicken, fish, low-fat cheese, or vegetarian foods such as soy.  °• Eat some healthy fats, such as olive oil, canola oil, and nuts.  °• Eat very little saturated fats. These unhealthy fats are found in butter, cream, and high-fat meats, such as bacon and sausage.  °• Eat very little or no trans fats. These unhealthy fats are found in all foods that list “partially hydrogenated oil” as an ingredient.  °Label Reading Tips °The Nutrition Facts panel on a label lists the grams of total carbohydrate in 1 standard serving. The label's standard serving may be larger or smaller than 1 carbohydrate serving. °To figure out how many carbohydrate servings are in the food: °• First, look at the label's standard serving size.  °• Check the grams of total carbohydrate. This is the amount of carbohydrate in 1 standard serving.  °• Divide the grams of total carbohydrate by 15. This number equals the number of carbohydrate servings in 1 standard serving. Remember: 1 carbohydrate serving is 15 grams of carbohydrate.  °• Note: You may ignore the grams of sugars on the Nutrition Facts panel because they are included in the grams of total carbohydrate. ° °Foods Recommended °1 serving = about 15 grams of carbohydrate °Starches °• 1 slice bread (1 ounce)  °• 1 tortilla (6-inch size)  °• ¼ large bagel (1 ounce)  °• 2 taco shells (5-inch   size)  °• ½ hamburger or hot dog bun (¾ ounce)  °• ¾ cup ready-to-eat unsweetened cereal  °• ½ cup cooked cereal  °• 1 cup broth-based soup  °• 4 to 6 small crackers  °• 1/3 cup pasta or rice (cooked)  °• ½ cup beans, peas, corn, sweet potatoes, winter squash, or mashed or boiled potatoes (cooked)  °• ¼ large baked potato (3 ounces)  °• ¾ ounce pretzels, potato chips, or tortilla chips  °• 3 cups popcorn (popped) °Fruit °• 1 small fresh fruit (¾ to 1 cup)  °• ½ cup canned or frozen fruit  °• 2 tablespoons dried fruit (blueberries,  cherries, cranberries, mixed fruit, raisins)  °• 17 small grapes (3 ounces)  °• 1 cup melon or berries  °• ½ cup unsweetened fruit juice °Milk °• 1 cup fat-free or reduced-fat milk  °• 1 cup soy milk  °• 2/3 cup (6 ounces) nonfat yogurt sweetened with sugar-free sweetener °Sweets and Desserts °• 2-inch square cake (unfrosted)  °• 2 small cookies (2/3 ounce)  °• ½ cup ice cream or frozen yogurt  °• ¼ cup sherbet or sorbet  °• 1 tablespoon syrup, jam, jelly, table sugar, or honey  °• 2 tablespoons light syrup °Other Foods °• Count 1 cup raw vegetables or ½ cup cooked nonstarchy vegetables as zero (0) carbohydrate servings or “free” foods. If you eat 3 or more servings at one meal, count them as 1 carbohydrate serving.  °• Foods that have less than 20 calories in each serving also may be counted as zero carbohydrate servings or “free” foods.  °• Count 1 cup of casserole or other mixed foods as 2 carbohydrate servings. ° °Carbohydrate Counting for People with Diabetes Sample 1-Day Menu  °Breakfast 1 extra-small banana (1 carbohydrate serving)  °1 cup low-fat or fat-free milk (1 carbohydrate serving)  °1 slice whole wheat bread (1 carbohydrate serving)  °1 teaspoon margarine  °Lunch 2 ounces turkey slices  °2 slices whole wheat bread (2 carbohydrate servings)  °2 lettuce leaves  °4 celery sticks  °4 carrot sticks  °1 medium apple (1 carbohydrate serving)  °1 cup low-fat or fat-free milk (1 carbohydrate serving)  °Afternoon Snack 2 tablespoons raisins (1 carbohydrate serving)  °3/4 ounce unsalted mini pretzels (1 carbohydrate serving)  °Evening Meal 3 ounces lean roast beef  °1/2 large baked potato (2 carbohydrate servings)  °1 tablespoon reduced-fat sour cream  °1/2 cup green beans  °1 tablespoon light salad dressing  °1 whole wheat dinner roll (1 carbohydrate serving)  °1 teaspoon margarine  °1 cup melon balls (1 carbohydrate serving)  °Evening Snack 2 tablespoons unsalted nuts  ° °Carbohydrate Counting for People with  Diabetes Vegan Sample 1-Day Menu  °Breakfast 1 cup cooked oatmeal (2 carbohydrate servings)  °½ cup blueberries (1 carbohydrate serving)  °2 tablespoons flaxseeds  °1 cup soymilk fortified with calcium and vitamin D  °1 cup coffee  °Lunch 2 slices whole wheat bread (2 carbohydrate servings)  °½ cup baked tofu  °¼ cup lettuce  °2 slices tomato  °2 slices avocado  °½ cup baby carrots  °1 orange (1 carbohydrate serving)  °1 cup soymilk fortified with calcium and vitamin D   °Evening Meal Burrito made with: 1 6-inch corn tortilla (1 carbohydrate serving)  °1 cup refried vegetarian beans (1 carbohydrate serving)  °¼ cup chopped tomatoes  °¼ cup lettuce  °¼ cup salsa  °1/3 cup brown rice (1 carbohydrate serving)  °1 tablespoon olive oil for rice  °½   cup zucchini   °Evening Snack 6 small whole grain crackers (1 carbohydrate serving)  °2 apricots (½ carbohydrate serving)  °¼ cup unsalted peanuts (½ carbohydrate serving)   ° ° °Carbohydrate Counting for People with Diabetes Vegetarian (Lacto-Ovo) Sample 1-Day Menu  °Breakfast 1 cup cooked oatmeal (2 carbohydrate servings)  °½ cup blueberries (1 carbohydrate serving)  °2 tablespoons flaxseeds  °1 egg  °1 cup 1% milk (1 carbohydrate serving)  °1 cup coffee  °Lunch 2 slices whole wheat bread (2 carbohydrate servings)  °2 ounces low-fat cheese  °¼ cup lettuce  °2 slices tomato  °2 slices avocado  °½ cup baby carrots  °1 orange (1 carbohydrate serving)  °1 cup unsweetened tea  °Evening Meal Burrito made with: 1 6-inch corn tortilla (1 carbohydrate serving)  °½ cup refried vegetarian beans (1 carbohydrate serving)  °¼ cup tomatoes  °¼ cup lettuce  °¼ cup salsa  °1/3 cup brown rice (1 carbohydrate serving)  °1 tablespoon olive oil for rice  °½ cup zucchini  °1 cup 1% milk (1 carbohydrate serving)  °Evening Snack 6 small whole grain crackers (1 carbohydrate serving)  °2 apricots (½ carbohydrate serving)  °¼ cup unsalted peanuts (½ carbohydrate serving)   ° °Copyright 2020 © [employer]  [employer]. All rights reserved. ° °Using Nutrition Labels: Carbohydrate ° °• Serving Size  °• Look at the serving size. All the information on the label is based on this portion. °• Servings Per Container  °• The number of servings contained in the package. °• Guidelines for Carbohydrate  °• Look at the total grams of carbohydrate in the serving size.  °• 1 carbohydrate choice = 15 grams of carbohydrate. °Range of Carbohydrate Grams Per Choice  °Carbohydrate Grams/Choice Carbohydrate Choices  °6-10 ½  °11-20 1  °21-25 1½  °26-35 2  °36-40 2½  °41-50 3  °51-55 3½  °56-65 4  °66-70 4½  °71-80 5  ° ° °Copyright 2020 © [employer]. All rights reserved. ° °

## 2019-07-10 NOTE — Progress Notes (Addendum)
Occupational Therapy Evaluation Patient Details Name: Austin Koch MRN: [id number] DOB: [birth year] Today's Date: 07/10/2019    History of Present Illness 33 year old male who presented with dyspnea to [facility]  He does have significant past medical history for asthma.  Patient reported 7 days of fatigue, dyspnea and fever.  Patient tested positive for COVID-19, 6 days prior to hospitalization.  On his initial physical examination he was hypoxic, he tested again positive for COVID-19 and transferred to [place]   PTA pt was living with fiance and 2 kids. Pt was independent in ADLs, IADLs, and mobility. Pt reports that fiance and 1 of 2 children have now tested positive for COVID19. Pt's performance limited by SOB and fatigue, requiring increased time to complete all tasks with mod rest breaks throughout. Pt's O2 SATs maintained in 90s throughout on room air with DOE 3/4. Pt able to reach 1046mL on incentive spirometer this date. Ed pt on breathing exercises with pt requiring min cues on technique. Ed pt energy conservation techniques and activity modifications strategies with good understanding. Pt demonstrates decreased strength, endurance, standing tolerance, and activity tolerance impacting ability to complete self-care and functional transfer tasks. Recommend skilled OT services to address above deficits in order to promote function and prevent further decline. No further OT needs after discharge.     Follow Up Recommendations  No OT follow up    Equipment Recommendations       Recommendations for Other Services       Precautions / Restrictions Precautions Precautions: None Restrictions Weight Bearing Restrictions: No      Mobility Bed Mobility Overal bed mobility: Independent                Transfers Overall transfer level: Independent Equipment used: None                  Balance Overall balance assessment: Independent                                          ADL either performed or assessed with clinical judgement   ADL Overall ADL's : Independent                                             Vision         Perception     Praxis      Pertinent Vitals/Pain Pain Assessment: No/denies pain     Hand Dominance Right   Extremity/Trunk Assessment Upper Extremity Assessment Upper Extremity Assessment: Generalized weakness   Lower Extremity Assessment Lower Extremity Assessment: Defer to PT evaluation       Communication Communication Communication: No difficulties   Cognition Arousal/Alertness: Awake/alert Behavior During Therapy: WFL for tasks assessed/performed Overall Cognitive Status: Within Functional Limits for tasks assessed                                     General Comments  Pt reports that fiance and 1 of 2 children have tested positive for COVID. Pt reports that additional family members can help them at home if needed (i.e. bring them groceries, etc). Pt required min to mod rest breaks throughout due to fatigue  and SOB. Pt reported min anxiety in regards to SOB.     Exercises Exercises: Other exercises;General Upper Extremity General Exercises - Upper Extremity Shoulder Flexion: AROM;Strengthening;5 reps;Seated;Theraband Theraband Level (Shoulder Flexion): Level 1 (Yellow) Other Exercises Other Exercises: Flutter valve x 10 Other Exercises: Incentive Spirometer x 10 Other Exercises: Pursed lip breathing x 10   Shoulder Instructions      Home Living Family/patient expects to be discharged to:: Private residence Living Arrangements: Spouse/significant other;Children Available Help at Discharge: Family Type of Home: Apartment Home Access: Elevator     Home Layout: One level     Bathroom Shower/Tub: Chief Strategy Officer: Standard     Home Equipment: None          Prior Functioning/Environment Level  of Independence: Independent                 OT Problem List: Decreased strength;Decreased activity tolerance;Decreased knowledge of use of DME or AE;Cardiopulmonary status limiting activity      OT Treatment/Interventions: Therapeutic exercise;Energy conservation;DME and/or AE instruction;Neuromuscular education;Therapeutic activities;Patient/family education    OT Goals(Current goals can be found in the care plan section) Acute Rehab OT Goals Patient Stated Goal: To go home OT Goal Formulation: With patient Time For Goal Achievement: 07/24/19 Potential to Achieve Goals: Good ADL Goals Additional ADL Goal #1: Pt to recall and verbalize 3/3 energy conservation techniques with 0 verbal cues. Additional ADL Goal #2: Pt to recall and demonstrate breathing exercises with 0 verbal cues.  OT Frequency: Min 2X/week   Barriers to D/C:            Co-evaluation              AM-PAC OT "6 Clicks" Daily Activity     Outcome Measure Help from another person eating meals?: None Help from another person taking care of personal grooming?: None Help from another person toileting, which includes using toliet, bedpan, or urinal?: None Help from another person bathing (including washing, rinsing, drying)?: A Little Help from another person to put on and taking off regular upper body clothing?: None Help from another person to put on and taking off regular lower body clothing?: None 6 Click Score: 23   End of Session Nurse Communication: Mobility status(Low BP 100/63)  Activity Tolerance: Patient limited by fatigue(Limited by SOB) Patient left: in chair;with call bell/phone within reach  OT Visit Diagnosis: Muscle weakness (generalized) (M62.81)                Time: 9509-3267 OT Time Calculation (min): 36 min Charges:  OT General Charges $OT Visit: 1 Visit OT Evaluation $OT Eval Moderate Complexity: 1 Mod OT Treatments $Therapeutic Exercise: 8-22 mins  Peterson Ao  OTR/L [phone]   Peterson Ao 07/10/2019, 1:50 PM

## 2019-07-10 NOTE — Discharge Summary (Signed)
Physician Discharge Summary  Austin Koch VZD:[id number] DOB: 1985/12/12 DOA: 07/05/2019  PCP: Patient, No Pcp Per  Admit date: 07/05/2019 Discharge date: 07/10/2019  Admitted From: Home  Disposition:  Home   Recommendations for Outpatient Follow-up and new medication changes:  1. Follow up with Primary care in 2 weeks.  2. Patient has been placed on metformin for T2DM. 3. Continue self quarantine for 2 weeks, maintain physical distancing and use a mask in public. 4. Prescribed glucometer, to follow fasting capillary blood glucose every morning and keep a log.   Home Health no  Equipment/Devices:    Discharge Condition: stable  CODE STATUS: full  Diet recommendation: diabetic prudent diet.   Brief/Interim Summary: 33 year old male who [facility] have significant past medical [facility] for asthma. Patient reported 7 days of fatigue, dyspnea and fever. Patient tested positive for COVID-19, 6 days prior to hospitalization. On his initial physical examination he was hypoxic, he tested again positive for COVID-19 and transferred to [facility]. By the time of his transfer his blood pressure was 133/80, pulse rate 85, respiratory rate 25, temperature 100.2 F, oximetry 89%, moist mucous membranes, lungs with rhonchi bilaterally, heart S1-S2 present rhythmic, abdomen soft, no lower extremity edema. Sodium 135, potassium 3.9, chloride 95, bicarb 26, glucose 224,BUN 11, creatinine 1.15,white count 7.2, hemoglobin 13.9, hematocrit 42.2, platelets 292.Chest radiograph with multifocal interstitial infiltrates,bibasilar and right upper lobe.  Patient was admitted to the hospital with a working diagnosis of acute hypoxic respiratory failure due to SARS COVID-19 viral pneumonia  Patient received remdesivir and systemic corticosteroids, supplemental oxygen per high flow nasal cannula, initially with high oxygen requirements up to 14 L/min per HFNC.  Hisfollow-up chest radiograph had persistent interstitial infiltrates, predominantly right upper lobe.Due to severe disease and persistent hypoxemia he received convalescent plasma and Actemra 07/06/19.   Patient slowly improving with decreasing oxygen requirements. Continue to be very weak and deconditioned. Infiltrate is predominately on the right upper lobe, and he was instructed for prone and left lateral decubitus.  Eventually supplemental oxygen was tapered off, with oxygenation on room air of 90%.    1.  Acute hypoxic respiratory failure due to SARS COVID-19 viral pneumonia.  Patient was admitted to the telemetry unit, he received supplemental oxygen, and medical therapy with remdesivir and systemic corticosteroids.  Initially he had high oxygen requirements, 14 L/min per high flow nasal cannula, follow-up chest radiograph with persistent diffuse infiltrates, he received convalescent plasma and Actemra November 16.   He was encouraged for prone and left lateral decubitus positioning, received antitussive agents, bronchodilators, and airway clearaning techniques with flutter valve incentive spirometer.  Vitamin C and zinc.  He responded well to medical therapy with improvement of his symptoms, inflammatory markers and oxygen requirements.  At discharge his oxygenation on room air is 90 to 92%.  Patient will undergo a amatory pulse oximetry on room air before discharge.  2.  Newly diagnosed type 2 diabetes mellitus with steroid-induced hyperglycemia.  Patient developed persistent hyperglycemia, required insulin therapy, basal and short acting.  At discharge his fasting glucose is 246, capillary glucose 171.  His hemoglobin A1c 11.8.  Patient will be discharged with Metformin and outpatient follow-up.  Steroids will be discontinued.  3.  Chronic anemia.  Cell counts remained stable.  4.  Asthma.  Patient had no evidence of an asthma exacerbation.  He will continue bronchodilator therapy at  home.    Discharge Diagnoses:  Principal Problem:   Acute respiratory disease due  to COVID-19 virus Active Problems:   Asthma, chronic, unspecified asthma severity, with acute exacerbation   Type 2 diabetes mellitus with other specified complication ([place])   Pneumonia due to COVID-19 virus    Discharge Instructions   Allergies as of 07/10/2019   No Known Allergies     Medication List    STOP taking these medications   benzonatate 100 MG capsule Commonly known as: Tessalon Perles     TAKE these medications   acetaminophen 325 MG tablet Commonly known as: TYLENOL Take 2 tablets (650 mg total) by mouth every 6 (six) hours as needed for mild pain or headache (fever >/= 101).   albuterol 108 (90 Base) MCG/ACT inhaler Commonly known as: VENTOLIN HFA Inhale 2 puffs into the lungs every 6 (six) hours as needed for wheezing or shortness of breath.   blood glucose meter kit and supplies Dispense based on patient and insurance preference. Use up to four times daily as directed. (FOR ICD-10 E10.9, E11.9).   guaiFENesin-dextromethorphan 100-10 MG/5ML syrup Commonly known as: ROBITUSSIN DM Take 10 mLs by mouth every 4 (four) hours as needed for cough.   metFORMIN 500 MG tablet Commonly known as: GLUCOPHAGE Take 1 tablet (500 mg total) by mouth 2 (two) times daily with a meal.       No Known Allergies  Consultations:     Procedures/Studies: Dg Chest 1 View  Result Date: 06/29/2019 CLINICAL DATA:  Cough and fever EXAM: CHEST  1 VIEW COMPARISON:  None. FINDINGS: The heart size and mediastinal contours are within normal limits. Both lungs are clear. The visualized skeletal structures are unremarkable. IMPRESSION: No active disease. Electronically Signed   By: Donavan Foil M.D.   On: 06/29/2019 22:52   Dg Chest Port 1 View  Result Date: 07/06/2019 CLINICAL DATA:  33 year old male with a history of dyspnea EXAM: PORTABLE CHEST 1 VIEW COMPARISON:  07/05/2019 FINDINGS:  Cardiomediastinal silhouette unchanged in size and contour. Low lung volumes persist. Persistent reticulonodular opacities of the lungs with improvement in aeration compared to the prior plain film. No pneumothorax or pleural effusion. IMPRESSION: Persisting bilateral reticulonodular opacities, with improved aeration compared to the prior plain film. Electronically Signed   By: Corrie Mckusick D.O.   On: 07/06/2019 08:31   Dg Chest Portable 1 View  Result Date: 07/05/2019 CLINICAL DATA:  Shortness of breath, COVID positive. EXAM: PORTABLE CHEST 1 VIEW COMPARISON:  06/29/2019 FINDINGS: Lung volumes are diminished compared to prior study. Cardiomediastinal contours are normal and airways are patent. Increased interstitial and airspace opacities throughout both the left and right chest. This is a diffuse process but with relative sparing of left upper lobe. No acute bone finding. IMPRESSION: Findings that could be seen in a severe atypical or viral pneumonia. Electronically Signed   By: Zetta Bills M.D.   On: 07/05/2019 11:31      Procedures:   Subjective: Patient is feeling better, continue to have dyspnea with exertion, no nausea or vomiting.   Discharge Exam: Vitals:   07/10/19 0408 07/10/19 0757  BP: 109/64 (!) 95/54  Pulse: 61 85  Resp: (!) 22 18  Temp: 98.3 F (36.8 C) 98.2 F (36.8 C)  SpO2: 92% 90%   Vitals:   07/09/19 2007 07/10/19 0408 07/10/19 0600 07/10/19 0757  BP: (!) 103/57 109/64  (!) 95/54  Pulse:  61  85  Resp:  (!) 22  18  Temp: 98 F (36.7 C) 98.3 F (36.8 C)  98.2 F (36.8 C)  TempSrc:  Oral Oral  Oral  SpO2:  92%  90%  Weight:   96.2 kg   Height:        General: Not in pain or dyspnea.  Neurology: Awake and alert, non focal  E ENT: no pallor, no icterus, oral mucosa moist Cardiovascular: No JVD. S1-S2 present, rhythmic. Pulmonary: positive breath sounds bilaterally. Gastrointestinal. Abdomen with no organomegaly, non tender, no rebound or  guarding Skin. No rashes Musculoskeletal: no joint deformities   The results of significant diagnostics from this hospitalization (including imaging, microbiology, ancillary and laboratory) are listed below for reference.     Microbiology: No results found for this or any previous visit (from the past 240 hour(s)).   Labs: BNP (last 3 results) Recent Labs    07/07/19 0415  BNP 30.0   Basic Metabolic Panel: Recent Labs  Lab 07/06/19 0400 07/07/19 0415 07/08/19 0533 07/09/19 0255 07/10/19 0016  NA 134* 137 137 131* 135  K 4.5 4.9 5.3* 4.3 4.6  CL 97* 98 96* 94* 96*  CO2 '23 26 27 25 26  ' GLUCOSE 348* 330* 286* 299* 246*  BUN 18 29* 28* 30* 29*  CREATININE 1.19 1.21 1.10 1.17 1.21  CALCIUM 8.8* 9.0 9.3 8.6* 9.0  MG 2.2 2.5* 2.5*  --   --   PHOS 2.9 4.0 4.4  --   --    Liver Function Tests: Recent Labs  Lab 07/06/19 0400 07/07/19 0415 07/08/19 0533 07/09/19 0255 07/10/19 0016  AST '30 27 23 17 21  ' ALT '21 19 18 15 16  ' ALKPHOS 79 77 78 67 63  BILITOT 0.6 0.6 0.6 1.1 1.1  PROT 7.7 7.8 7.5 7.2 7.0  ALBUMIN 3.3* 3.3* 3.3* 3.4* 3.3*   No results for input(s): LIPASE, AMYLASE in the last 168 hours. No results for input(s): AMMONIA in the last 168 hours. CBC: Recent Labs  Lab 07/05/19 1143 07/06/19 0400 07/07/19 0415 07/08/19 0533  WBC 7.2 6.1 11.2* 12.4*  NEUTROABS  --  5.3 9.8* 10.7*  HGB 13.9 13.7 14.2 14.4  HCT 42.2 42.5 44.2 44.9  MCV 84.1 86.7 86.8 87.7  PLT 292 318 413* 444*   Cardiac Enzymes: No results for input(s): CKTOTAL, CKMB, CKMBINDEX, TROPONINI in the last 168 hours. BNP: Invalid input(s): POCBNP CBG: Recent Labs  Lab 07/08/19 2000 07/09/19 0749 07/09/19 1158 07/09/19 1636 07/10/19 0752  GLUCAP 277* 297* 228* 148* 171*   D-Dimer Recent Labs    07/09/19 0255 07/10/19 0016  DDIMER 0.93* 1.48*   Hgb A1c No results for input(s): HGBA1C in the last 72 hours. Lipid Profile No results for input(s): CHOL, HDL, LDLCALC, TRIG,  CHOLHDL, LDLDIRECT in the last 72 hours. Thyroid function studies No results for input(s): TSH, T4TOTAL, T3FREE, THYROIDAB in the last 72 hours.  Invalid input(s): FREET3 Anemia work up Recent Labs    07/09/19 0255 07/10/19 0016  FERRITIN 510* 592*   Urinalysis No results found for: COLORURINE, APPEARANCEUR, LABSPEC, Brady, GLUCOSEU, HGBUR, BILIRUBINUR, KETONESUR, PROTEINUR, UROBILINOGEN, NITRITE, LEUKOCYTESUR Sepsis Labs Invalid input(s): PROCALCITONIN,  WBC,  LACTICIDVEN Microbiology No results found for this or any previous visit (from the past 240 hour(s)).   Time coordinating discharge: 45 minutes  SIGNED:   Tawni Millers, MD  [facility] 07/10/2019, 8:16 AM

## 2019-07-10 NOTE — Progress Notes (Signed)
Nutrition Consult Diet Education  Received consult for diabetes diet education. Called patient's room, but no one answered.  Noted plans for d/c home today. RD to attach DM education materials to discharge instructions.  Recommend outpatient diabetes diet education.  Molli Barrows, RD, LDN, [place] Pager [phone] After Hours Pager [phone]

## 2019-07-10 NOTE — Progress Notes (Signed)
Pt walked 530ft in hall and no O2 was needed. Sats stayed above 90% for entirety of walk.

## 2019-07-10 NOTE — TOC Transition Note (Signed)
Transition of Care [facility]) - CM/SW Discharge Note   Patient Details  Name: Austin Koch MRN: [id number] Date of Birth: May 29, 1986  [facility] CM/SW Contact:  Ninfa Meeker, RN Phone Number: [phone] (working remotely) 07/10/2019, 11:37 AM   Clinical Narrative:   Case manager contacted to arrange for PCP for patient, Case manager informed patient to contact [employer] at the number on the back of his card. Telephonic hospital follow up appointment scheduled for Monday, 07/20/19 at 2:30 pm. Patient notified of same and it is added to AVS. No further needs identified.           Patient Goals and CMS Choice        Discharge Placement                       Discharge Plan and Services                                     Social Determinants of Health (SDOH) Interventions     Readmission Risk Interventions No flowsheet data found.

## 2019-07-10 NOTE — Progress Notes (Signed)
Inpatient Diabetes Program Recommendations  AACE/ADA: New Consensus Statement on Inpatient Glycemic Control (2015)  Target Ranges:  Prepandial:   less than 140 mg/dL      Peak postprandial:   less than 180 mg/dL (1-2 hours)      Critically ill patients:  140 - 180 mg/dL   Lab Results  Component Value Date   GLUCAP 191 (H) 07/10/2019   HGBA1C 11.8 (H) 07/06/2019    Review of Glycemic Control  Inpatient Diabetes Program Recommendations:   Spoke with patient via phone regarding new onset diabetes DM2. Patient states he has been drinking a lot of juices prior to admission. Reviewed with patient basic plate method including limiting foods and drinks high in carbohydrates and sugars. Discussed fruits best for patient to eat. Reviewed with patient to check CBGs and followup with physician as ordered. Discussed rationale for Metformin and how assists with CBGs. Reviewed normal range of CBGs including post prandial. Patient has started reading Living Well With Diabetes. Patient unsure if he is able to attend an outpatient diabetes class @ this time. Answered basic questions with no further questions @ this time.  Thank you, Nani Gasser. Khristi Schiller, RN, MSN, CDE  Diabetes Coordinator Inpatient Glycemic Control Team Team Pager [phone] (8am-5pm) 07/10/2019 11:17 AM

## 2019-07-20 ENCOUNTER — Telehealth: Payer: Self-pay

## 2019-07-20 ENCOUNTER — Ambulatory Visit (INDEPENDENT_AMBULATORY_CARE_PROVIDER_SITE_OTHER): Payer: 59 | Admitting: Family Medicine

## 2019-07-20 DIAGNOSIS — U071 COVID-19: Secondary | ICD-10-CM

## 2019-07-20 DIAGNOSIS — R0602 Shortness of breath: Secondary | ICD-10-CM

## 2019-07-20 DIAGNOSIS — J1282 Pneumonia due to coronavirus disease 2019: Secondary | ICD-10-CM

## 2019-07-20 DIAGNOSIS — J1289 Other viral pneumonia: Secondary | ICD-10-CM

## 2019-07-20 DIAGNOSIS — Z09 Encounter for follow-up examination after completed treatment for conditions other than malignant neoplasm: Secondary | ICD-10-CM

## 2019-07-20 DIAGNOSIS — E1165 Type 2 diabetes mellitus with hyperglycemia: Secondary | ICD-10-CM

## 2019-07-20 DIAGNOSIS — J4520 Mild intermittent asthma, uncomplicated: Secondary | ICD-10-CM

## 2019-07-20 MED ORDER — METFORMIN HCL 500 MG PO TABS: 500.0000 mg | ORAL_TABLET | Freq: Two times a day (BID) | ORAL | 3 refills | Status: AC

## 2019-07-20 NOTE — Telephone Encounter (Signed)
Copied from [place] [id number]. Topic: Appointment Scheduling - Scheduling Inquiry for Clinic >> Jul 17, 2019 12:24 PM Sheran Luz wrote: Patient calling to inquire if he can change upcoming appointment to in person. He had previously testing positive for covid-19 and is still having lingering SOB but has been released from quarantine.  Patient states he has to drop off FMLA forms.

## 2019-07-20 NOTE — Progress Notes (Signed)
Virtual Visit via Telephone Note  I connected with Austin Koch on 07/20/19 at  2:30 PM EST by telephone and verified that I am speaking with the correct person using two identifiers.   I discussed the limitations, risks, security and privacy concerns of performing an evaluation and management service by telephone and the availability of in person appointments. I also discussed with the patient that there may be a patient responsible charge related to this service. The patient expressed understanding and agreed to proceed.  Patient Location: Home Provider Location: [facility] Office Others participating in call: none   History of Present Illness:        33 year old male new to the practice, who is status post hospitalization from 07/05/2019 through 07/10/2019 at [facility] due to acute hypoxic respiratory failure due to COVID-19 viral pneumonia.  He was treated with remdesivir and systemic corticosteroids and high flow supplemental oxygen by nasal cannula.  Due to severe disease and persistent hypoxemia he also received convalescent plasma and Actemra on 07/06/2019.  During his hospitalization he also had diagnosis of type 2 diabetes with steroid-induced hyperglycemia.  He was started on Metformin and also provided with a glucometer.  He also has a history of asthma which he reports has been well controlled prior to his hospitalization. He reports no emergency department visits and no use of albuterol as an adult due to his asthma.  He feels that his asthma symptoms in the past have been triggered if he has a respiratory infection such as a cold/bronchitis.         He reports a status post hospitalization, his blood sugars have generally been no higher than the 130s.  He denies any increased thirst, no urinary frequency and no blurred vision related to his diabetes.  He also reports no fever since the time of his hospitalization.  No current issues with headaches  or dizziness.  He did not have any loss of sense of smell or taste.  He denies any body aches.  He continues however to have shortness of breath with minimal exertion.  He does not have a home pulse oximeter. (Per hospital discharge summary, his oxygen saturations ranged from 90 to 92% on room air at time of hospital discharge).  He reports that his current job is somewhat physically demanding and he does not feel that he would be able to return to work at this time and be able to perform his job at the level that he did prior to hospitalization.  He does have an occasional cough which is now nonproductive.  He feels as if he has difficulty taking in a deep breath.  When he tries to take in a deep breath this will cause him to have onset of cough and some sensation of chest tightness.  He does not feel as if he is wheezing.  He declines albuterol inhaler prescription.  He denies any abdominal pain-no nausea/vomiting/diarrhea or constipation.  No dysuria.  No chest pain or palpitations.  No peripheral edema.  No rash.   Past Medical History:  Diagnosis Date  . Asthma     Past Surgical History:  Procedure Laterality Date  . NO PAST SURGERIES      Family History  Problem Relation Age of Onset  . Diabetes Mother   . Bone cancer Maternal Grandmother     Social History   Tobacco Use  . Smoking status: Former Research scientist (life sciences)  . Smokeless tobacco: Never Used  Substance Use Topics  . Alcohol use: Yes  . Drug use: Never     No Known Allergies     Observations/Objective: No vital signs or physical exam conducted as visit was done via telephone  Assessment and Plan: 1. Shortness of breath 2. Pneumonia due to COVID-19 virus 4.  Mild intermittent asthma; 5.  Hospital discharge follow-up He reports that he feels better status post hospitalization for acute respiratory failure due to viral pneumonia associated with COVID-19 infection however he continues to feel short of breath with exertion.  He also  reports a history of mild intermittent asthma.  During hospitalization patient required corticosteroids, remdesivir, convalescent plasma and Actemra.  Due to patient's continued shortness of breath along with history of asthma and significant lung disease with persistent hypoxemia during hospitalization, he will be referred to pulmonology for further evaluation and treatment.  Note will be sent to patient to provide his employer that he should expect to be out of work with tentative return date of Monday, August 03, 2019. - Ambulatory referral to Pulmonology  3. Type 2 diabetes mellitus with hyperglycemia, without long-term current use of insulin (HCC) He reports his blood sugars are now no higher than 130 and he does not have any symptoms such as increased thirst, blurred vision or urinary frequency associated with his blood sugars.  He is encouraged to continue low carbohydrate diet and refill sent to his pharmacy to continue use of metformin.  Patient should call or return to clinic sooner if he has any symptoms such as increased thirst, blurred vision or urinary frequency, fasting blood sugars greater than 140 or any other concerns.  Hemoglobin A1c during hospitalization was elevated at 11.8 on 07/06/2019 but patient was also receiving high-dose steroids. - metFORMIN (GLUCOPHAGE) 500 MG tablet; Take 1 tablet (500 mg total) by mouth 2 (two) times daily with a meal.  Dispense: 60 tablet; Refill: 3  Addendum: FMLA paperwork received and completed on 07/20/2019 and will be returned by fax by medical assistant  Follow Up Instructions:Return in about 10 weeks (around 09/28/2019) for Diabetes/asthma; sooner if needed.    I discussed the assessment and treatment plan with the patient. The patient was provided an opportunity to ask questions and all were answered. The patient agreed with the plan and demonstrated an understanding of the instructions.   The patient was advised to call back or seek an  in-person evaluation if the symptoms worsen or if the condition fails to improve as anticipated.  I provided 22 minutes of non-face-to-face time during this encounter.   Cain Saupe, MD

## 2019-07-20 NOTE — Progress Notes (Signed)
New patient. COVID follow up. Still having cough & SHOB.  Wanted fax number to give to HR for them to fax over [facility] paperwork.  Has been checking FSBS fasting & before dinner. Morning readings in the 140s.

## 2019-07-21 ENCOUNTER — Ambulatory Visit (INDEPENDENT_AMBULATORY_CARE_PROVIDER_SITE_OTHER): Payer: 59 | Admitting: Physician Assistant

## 2019-07-21 ENCOUNTER — Telehealth: Payer: Self-pay | Admitting: Physician Assistant

## 2019-07-21 NOTE — Progress Notes (Deleted)
Patient: Austin Koch, Male    DOB: 1986-06-15, 33 y.o.   MRN: [id number] Visit Date: 07/21/2019  Today's Provider: Trinna Post, PA-C   Chief Complaint  Patient presents with  . New Patient (Initial Visit)   Subjective:    New Patient Appointment Austin Koch is a 33 y.o. male who presents today for new patient appointment. He feels {DESC; WELL/FAIRLY WELL/POORLY:18703}. He reports exercising ***. He reports he is sleeping {DESC; WELL/FAIRLY WELL/POORLY:18703}.  -----------------------------------------------------------------   Review of Systems  Constitutional: Negative.   HENT: Negative.   Eyes: Negative.   Respiratory: Negative.   Cardiovascular: Negative.   Gastrointestinal: Negative.   Endocrine: Negative.   Genitourinary: Negative.   Musculoskeletal: Negative.   Skin: Negative.   Allergic/Immunologic: Negative.   Neurological: Negative.   Hematological: Negative.   Psychiatric/Behavioral: Negative.     Social History He  reports that he has quit smoking. He has never used smokeless tobacco. He reports current alcohol use. He reports that he does not use drugs. Social History   Socioeconomic History  . Marital status: Single    Spouse name: Not on file  . Number of children: Not on file  . Years of education: Not on file  . Highest education level: Not on file  Occupational History  . Not on file  Social Needs  . Financial resource strain: Not on file  . Food insecurity    Worry: Not on file    Inability: Not on file  . Transportation needs    Medical: Not on file    Non-medical: Not on file  Tobacco Use  . Smoking status: Former Research scientist (life sciences)  . Smokeless tobacco: Never Used  Substance and Sexual Activity  . Alcohol use: Yes  . Drug use: Never  . Sexual activity: Not on file  Lifestyle  . Physical activity    Days per week: Not on file    Minutes per session: Not on file  . Stress: Not on file  Relationships  . Social Product manager on phone: Not on file    Gets together: Not on file    Attends religious service: Not on file    Active member of club or organization: Not on file    Attends meetings of clubs or organizations: Not on file    Relationship status: Not on file  Other Topics Concern  . Not on file  Social History Narrative  . Not on file    Patient Active Problem List   Diagnosis Date Noted  . Type 2 diabetes mellitus with other specified complication ([place]) 65/[id number]  . Pneumonia due to COVID-19 virus 07/10/2019  . Acute respiratory disease due to COVID-19 virus 07/05/2019  . Asthma, chronic, unspecified asthma severity, with acute exacerbation 07/05/2019    Past Surgical History:  Procedure Laterality Date  . NO PAST SURGERIES      Family History  Family Status  Relation Name Status  . Mother  (Not Specified)  . MGM  (Not Specified)   His family history includes Bone cancer in his maternal grandmother; Diabetes in his mother.     No Known Allergies  Previous Medications   BLOOD GLUCOSE METER KIT AND SUPPLIES    Dispense based on patient and insurance preference. Use up to four times daily as directed. (FOR ICD-10 E10.9, E11.9).   METFORMIN (GLUCOPHAGE) 500 MG TABLET    Take 1 tablet (500 mg total) by mouth 2 (two) times daily with  a meal.    Patient Care Team: Patient, No Pcp Per as PCP - General (General Practice)      Objective:   Vitals: There were no vitals taken for this visit.   Physical Exam   Depression Screen No flowsheet data found.    Assessment & Plan:     Routine Health Maintenance and Physical Exam  Exercise Activities and Dietary recommendations Goals   None     Immunization History  Administered Date(s) Administered  . Influenza,inj,Quad PF,6+ Mos 05/02/2016, 07/10/2019  . Pneumococcal Polysaccharide-23 07/10/2019    Health Maintenance  Topic Date Due  . FOOT EXAM  11/20/1996  . OPHTHALMOLOGY EXAM  11/20/1996  . URINE MICROALBUMIN   11/20/1996  . TETANUS/TDAP  11/20/2005  . HEMOGLOBIN A1C  01/03/2020  . INFLUENZA VACCINE  Completed  . PNEUMOCOCCAL POLYSACCHARIDE VACCINE AGE 45-64 HIGH RISK  Completed  . HIV Screening  Completed     Discussed health benefits of physical activity, and encouraged him to engage in regular exercise appropriate for his age and condition.    --------------------------------------------------------------------

## 2019-07-21 NOTE — Telephone Encounter (Signed)
Patient called back at 1:40 PM, 20 minutes after initial missed call for new patient appointment at [facility]. Explained to patient that since he has established yesterday 07/20/2019 with Dr. Antony Blackbird he wouldn't be able to establish here. Patient has a very long in depth visit and corresponding note in the chart. Patient says the hospital set this up and he didn't know who he talked to. Patient is asking me if anybody sent me paperwork and I have advised patient that even if they did I am not permitted to look at it since he is not my patient.  Forwarding to his PCP as FYI.

## 2019-07-21 NOTE — Telephone Encounter (Signed)
Austin Koch, please contact patient and let him know that his paperwork has been completed (green folder) and let him know if it was already faxed. If the other practice that he would like to be seen at is closer to his home then he can call them and let them know that he is no longer established here and would like his ongoing care at the office in [place]. He should just let you know when he has made appointment or if it may better if you call the office as well and let them know if he has decided to cancel care at [facility] and establish in [facility]

## 2019-07-22 NOTE — Telephone Encounter (Signed)
Patient was seen at another doctor's office on 07/20/2019.

## 2019-07-22 NOTE — Telephone Encounter (Signed)
Patient notified

## 2019-07-23 ENCOUNTER — Ambulatory Visit (INDEPENDENT_AMBULATORY_CARE_PROVIDER_SITE_OTHER): Payer: BLUE CROSS/BLUE SHIELD | Admitting: Critical Care Medicine

## 2019-07-23 ENCOUNTER — Encounter: Payer: Self-pay | Admitting: Critical Care Medicine

## 2019-07-23 ENCOUNTER — Other Ambulatory Visit: Payer: Self-pay

## 2019-07-23 DIAGNOSIS — Z8616 Personal history of COVID-19: Secondary | ICD-10-CM

## 2019-07-23 DIAGNOSIS — J4530 Mild persistent asthma, uncomplicated: Secondary | ICD-10-CM | POA: Diagnosis not present

## 2019-07-23 DIAGNOSIS — Z8619 Personal history of other infectious and parasitic diseases: Secondary | ICD-10-CM | POA: Diagnosis not present

## 2019-07-23 NOTE — Patient Instructions (Addendum)
Thank you for visiting Dr. Carlis Abbott at [facility]. We recommend the following: Orders Placed This Encounter  Procedures  . CT Chest High Resolution  . Pulmonary function test   Orders Placed This Encounter  Procedures  . CT Chest High Resolution    Please schedule in ~4 weeks (late December, early January)    Standing Status:   Future    Standing Expiration Date:   09/22/2020    Order Specific Question:   ** REASON FOR EXAM (FREE TEXT)    Answer:   post-covid, DOE    Order Specific Question:   Preferred imaging location?    Answer:   [facility]    Order Specific Question:   Radiology Contrast Protocol - do NOT remove file path    Answer:   [email or web address]  . Pulmonary function test    Please schedule in ~4 weeks (late December, early January)    Standing Status:   Future    Standing Expiration Date:   07/22/2020    Order Specific Question:   Where should this test be performed?    Answer:   [facility]     Return in about 1 month (around 08/23/2019).    Please do your part to reduce the spread of COVID-19.

## 2019-07-23 NOTE — Progress Notes (Signed)
Synopsis: Referred in December 2020 for shortness of breath by No ref. provider found.  Subjective:   PATIENT ID: Austin Koch GENDER: male DOB: 12-27-1985, MRN: [id number]  Chief Complaint  Patient presents with  . Consult    Patient recently had covid and has continued to have shortness of breath with exertion. Patient states when he inhales he feels like he can't get a good breath.     I connected with  Austin Koch on 07/23/19 by a video enabled telemedicine application and verified that I am speaking with the correct person using two identifiers.   I discussed the limitations of evaluation and management by telemedicine. The patient expressed understanding and agreed to proceed.    Austin Koch is a 33 year old gentleman with a history of childhood asthma who was referred for evaluation of dyspnea on exertion.  He was diagnosed with COVID-19 viral pneumonia on 11/9 and required hospitalization at [facility] from 11/15-11/20.  He received remdesivir, steroids, convalescent plasma, and Actemra on 11/16.  He was discharged home without supplemental oxygen.  He was incidentally diagnosed with diabetes (A1c 11.8) during his hospital stay.  Prior to Covid he could exercise regularly and had activity limitations.  He was not even requiring albuterol inhaler.  He cannot member the last time he had even used an inhaler.  He was hospitalized a few times as a child with asthma.  Since discharge she has used albuterol a few times a week with improvement in his symptoms.  He does not think that his symptoms are similar to when he had asthma as a child.  He does not want to start a maintenance inhaler.  Since discharge his dyspnea on exertion is slowly improving.  His coughing is improved.  His activity still significantly limited; he can only walk about half a block before stopping due to dyspnea.  He denies wheezing or nighttime symptoms.  He describes it as feeling so he cannot take a full  breath.  He has not been checking his oxygen saturations at home.  He has a history of tobacco abuse, quitting about 2 months ago.  He previously smoked for about 15 years, < 0.5 ppd.  He does not vape.  His brother also has asthma, but there is otherwise not a family history of lung disease.     Past Medical History:  Diagnosis Date  . Asthma      Family History  Problem Relation Age of Onset  . Diabetes Mother   . Bone cancer Maternal Grandmother      Past Surgical History:  Procedure Laterality Date  . NO PAST SURGERIES      Social History   Socioeconomic History  . Marital status: Single    Spouse name: Not on file  . Number of children: Not on file  . Years of education: Not on file  . Highest education level: Not on file  Occupational History  . Not on file  Social Needs  . Financial resource strain: Not on file  . Food insecurity    Worry: Not on file    Inability: Not on file  . Transportation needs    Medical: Not on file    Non-medical: Not on file  Tobacco Use  . Smoking status: Former Research scientist (life sciences)  . Smokeless tobacco: Never Used  Substance and Sexual Activity  . Alcohol use: Yes  . Drug use: Never  . Sexual activity: Not on file  Lifestyle  . Physical activity  Days per week: Not on file    Minutes per session: Not on file  . Stress: Not on file  Relationships  . Social Herbalist on phone: Not on file    Gets together: Not on file    Attends religious service: Not on file    Active member of club or organization: Not on file    Attends meetings of clubs or organizations: Not on file    Relationship status: Not on file  . Intimate partner violence    Fear of current or ex partner: Not on file    Emotionally abused: Not on file    Physically abused: Not on file    Forced sexual activity: Not on file  Other Topics Concern  . Not on file  Social History Narrative  . Not on file     No Known Allergies   Immunization History   Administered Date(s) Administered  . Influenza,inj,Quad PF,6+ Mos 05/02/2016, 07/10/2019  . Pneumococcal Polysaccharide-23 07/10/2019    Outpatient Medications Prior to Visit  Medication Sig Dispense Refill  . blood glucose meter kit and supplies Dispense based on patient and insurance preference. Use up to four times daily as directed. (FOR ICD-10 E10.9, E11.9). 1 each 0  . metFORMIN (GLUCOPHAGE) 500 MG tablet Take 1 tablet (500 mg total) by mouth 2 (two) times daily with a meal. 60 tablet 3   No facility-administered medications prior to visit.     Review of Systems  Constitutional: Negative.   HENT: Negative.   Respiratory: Positive for cough and shortness of breath. Negative for wheezing.   Cardiovascular: Negative.   Gastrointestinal: Negative.   Genitourinary: Negative.   Musculoskeletal: Negative.   Skin: Negative.   Neurological: Negative.      Objective:  There were no vitals filed for this visit.   on   RA BMI Readings from Last 3 Encounters:  07/10/19 27.21 kg/m  07/05/19 28.25 kg/m  06/29/19 28.25 kg/m   Wt Readings from Last 3 Encounters:  07/10/19 212 lb (96.2 kg)  07/05/19 220 lb (99.8 kg)  06/29/19 220 lb (99.8 kg)    Physical Exam -  Limited due to televisit.  No conversational dyspnea. No witnessed coughing. Normal speech, answering questions appropriately.   CBC    Component Value Date/Time   WBC 12.4 (H) 07/08/2019 0533   RBC 5.12 07/08/2019 0533   HGB 14.4 07/08/2019 0533   HCT 44.9 07/08/2019 0533   PLT 444 (H) 07/08/2019 0533   MCV 87.7 07/08/2019 0533   MCH 28.1 07/08/2019 0533   MCHC 32.1 07/08/2019 0533   RDW 12.6 07/08/2019 0533   LYMPHSABS 1.0 07/08/2019 0533   MONOABS 0.6 07/08/2019 0533   EOSABS 0.0 07/08/2019 0533   BASOSABS 0.0 07/08/2019 0533      Chest Imaging- films reviewed: CXR, 1 view 07/06/2019-right > left patchy infiltrates  Pulmonary Functions Testing Results: No flowsheet data found.      Assessment  & Plan:     ICD-10-CM   1. Moderate persistent asthma without complication  E26.83   2. History of 2019 novel coronavirus disease (COVID-19)  Z86.19     Moderate persistent asthma -PFTs -Declined starting maintenance inhaler despite using rescue inhaler twice weekly with improvement in symptoms -Up-to-date on seasonal flu and pneumococcal 23 vaccinations.  Needs Prevnar in 6 months. -Continue Covid precautions-social distancing, mask wearing, handwashing.  History of COVID-19 viral pneumonia -We will obtain high-resolution CT 6-8 weeks after his pneumonia to evaluate  for residual parenchymal disease. -PFTs 6-8 weeks after COVID  RTC in about 1 month after CT and PFTs.   Current Outpatient Medications:  .  blood glucose meter kit and supplies, Dispense based on patient and insurance preference. Use up to four times daily as directed. (FOR ICD-10 E10.9, E11.9)., Disp: 1 each, Rfl: 0 .  metFORMIN (GLUCOPHAGE) 500 MG tablet, Take 1 tablet (500 mg total) by mouth 2 (two) times daily with a meal., Disp: 60 tablet, Rfl: 3   Julian Hy, DO [facility] 07/23/2019 7:14 AM

## 2019-08-28 ENCOUNTER — Ambulatory Visit (HOSPITAL_COMMUNITY): Admission: RE | Admit: 2019-08-28 | Payer: BLUE CROSS/BLUE SHIELD | Source: Ambulatory Visit

## 2020-07-29 ENCOUNTER — Other Ambulatory Visit: Payer: Self-pay | Admitting: Family Medicine

## 2020-07-29 DIAGNOSIS — E1165 Type 2 diabetes mellitus with hyperglycemia: Secondary | ICD-10-CM

## 2020-07-29 NOTE — Telephone Encounter (Signed)
  Notes to clinic Is this pt associated with your practice, no PCP listed, however Dr. Jillyn Hidden wrote this rx.

## 2020-08-02 NOTE — Telephone Encounter (Signed)
Patient no longer seen at this practice

## 2020-08-20 ENCOUNTER — Other Ambulatory Visit: Payer: Self-pay

## 2020-08-20 ENCOUNTER — Ambulatory Visit
Admission: EM | Admit: 2020-08-20 | Discharge: 2020-08-20 | Disposition: A | Discharge status: Home / Self Care | Payer: BC Managed Care – PPO | Attending: Family Medicine | Admitting: Family Medicine

## 2020-08-20 DIAGNOSIS — Z20822 Contact with and (suspected) exposure to covid-19: Secondary | ICD-10-CM | POA: Diagnosis present

## 2020-08-20 NOTE — ED Triage Notes (Signed)
Patient states that he was exposed to covid. Patient with no current symptoms.

## 2020-08-20 NOTE — Discharge Instructions (Signed)

## 2020-08-21 LAB — SARS CORONAVIRUS 2 (TAT 6-24 HRS): SARS Coronavirus 2: NEGATIVE

## 2020-09-04 IMAGING — DX DG CHEST 1V
1 series · 1 of 1 positions shown · non-contrast
Comparison: None.

CLINICAL DATA: Cough and fever

EXAM:
CHEST  1 VIEW

[chest ap]
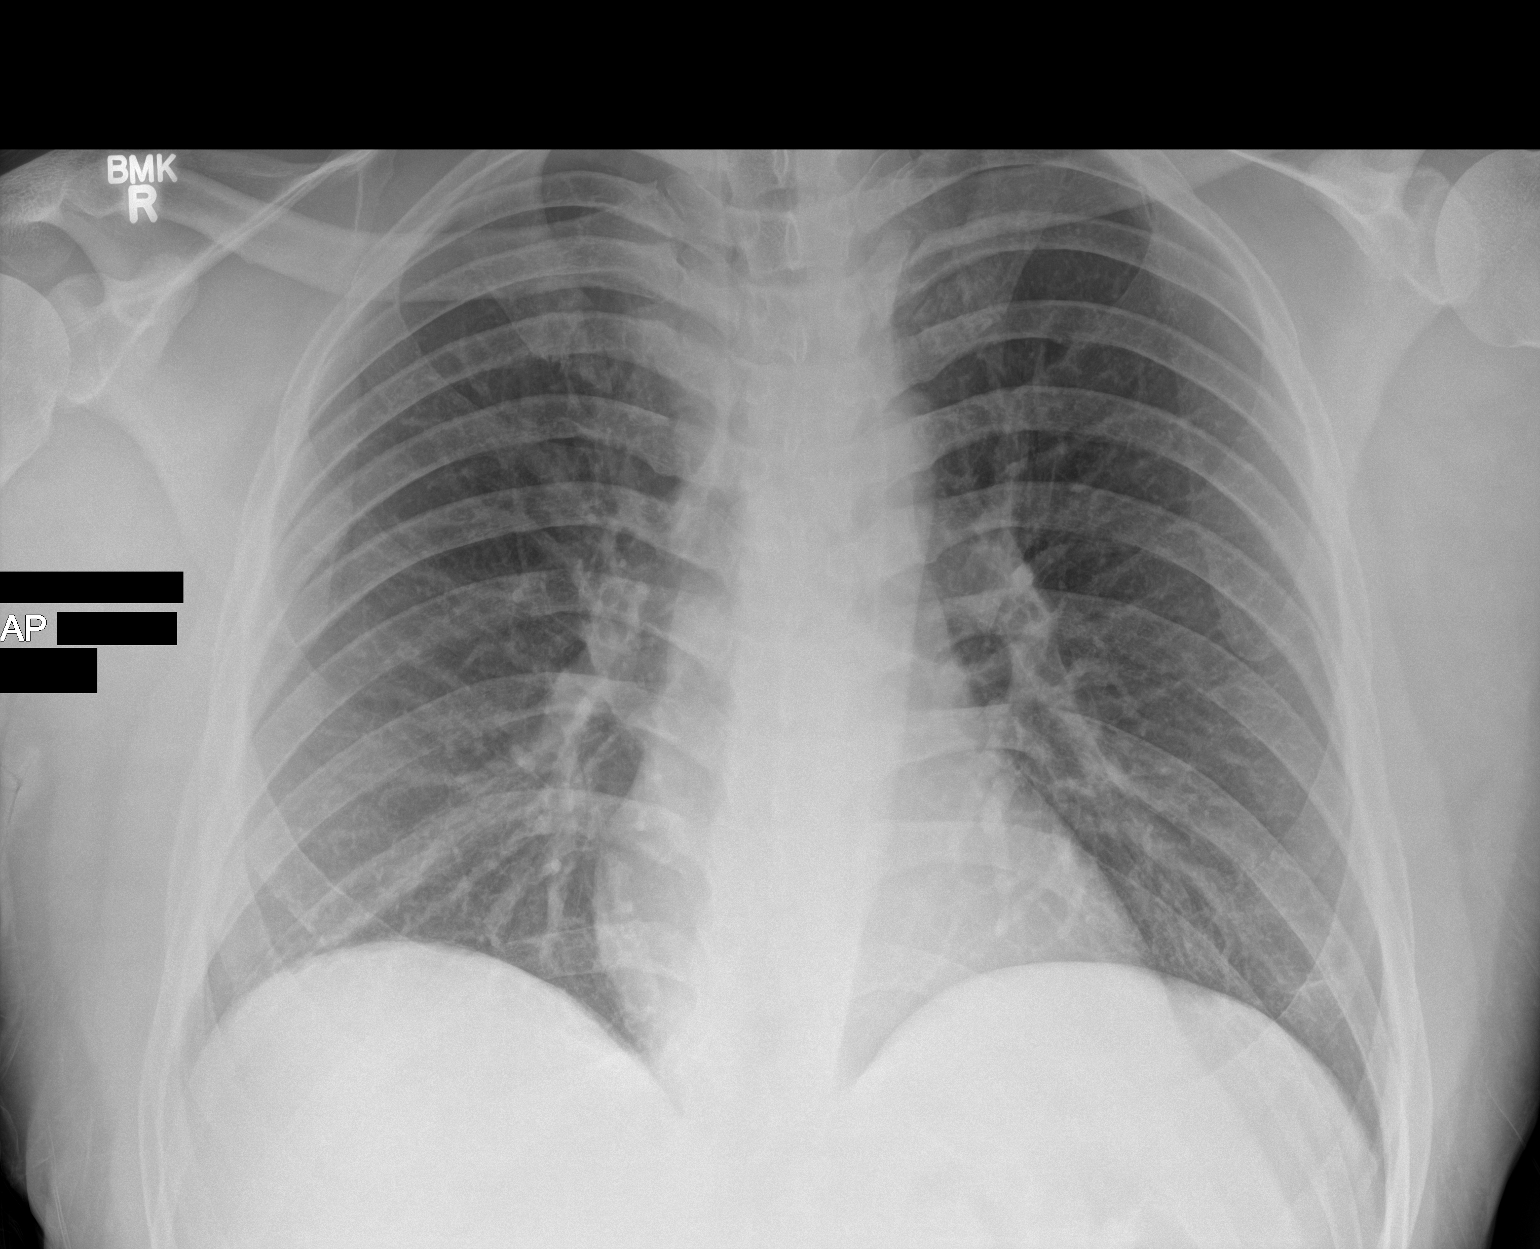

[1 of 1 positions shown; findings below may reference images not displayed]

FINDINGS: The heart size and mediastinal contours are within normal limits.
Both lungs are clear. The visualized skeletal structures are
unremarkable.
IMPRESSION: No active disease.

## 2020-09-10 IMAGING — DX DG CHEST 1V PORT
1 series · 1 of 1 positions shown · non-contrast
Comparison: 06/29/2019

CLINICAL DATA: Shortness of breath, COVID positive.

EXAM:
PORTABLE CHEST 1 VIEW

[chest ap]
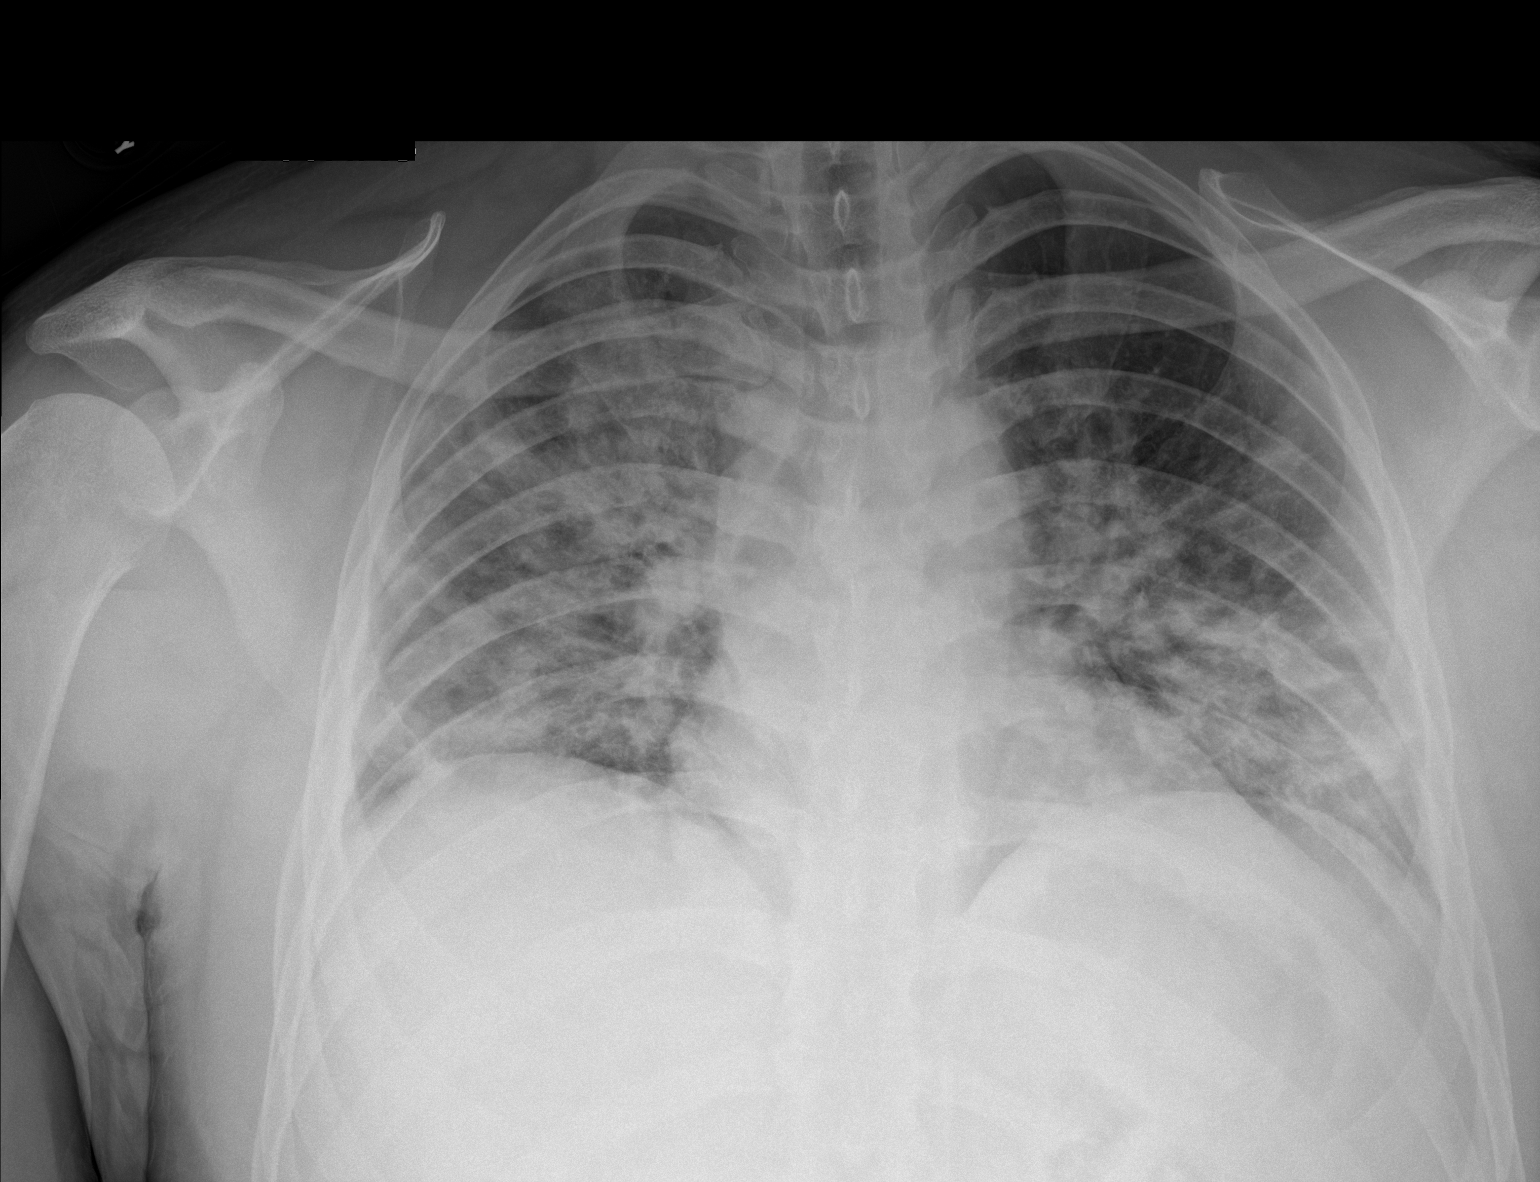

[1 of 1 positions shown; findings below may reference images not displayed]

FINDINGS: Lung volumes are diminished compared to prior study.
Cardiomediastinal contours are normal and airways are patent.
Increased interstitial and airspace opacities throughout both the
left and right chest. This is a diffuse process but with relative
sparing of left upper lobe.

No acute bone finding.
IMPRESSION: Findings that could be seen in a severe atypical or viral pneumonia.

## 2020-09-11 IMAGING — DX DG CHEST 1V PORT
1 series · 1 of 1 positions shown · non-contrast
Comparison: 07/05/2019

CLINICAL DATA: 33-year-old male with a history of dyspnea

EXAM:
PORTABLE CHEST 1 VIEW

[chest]
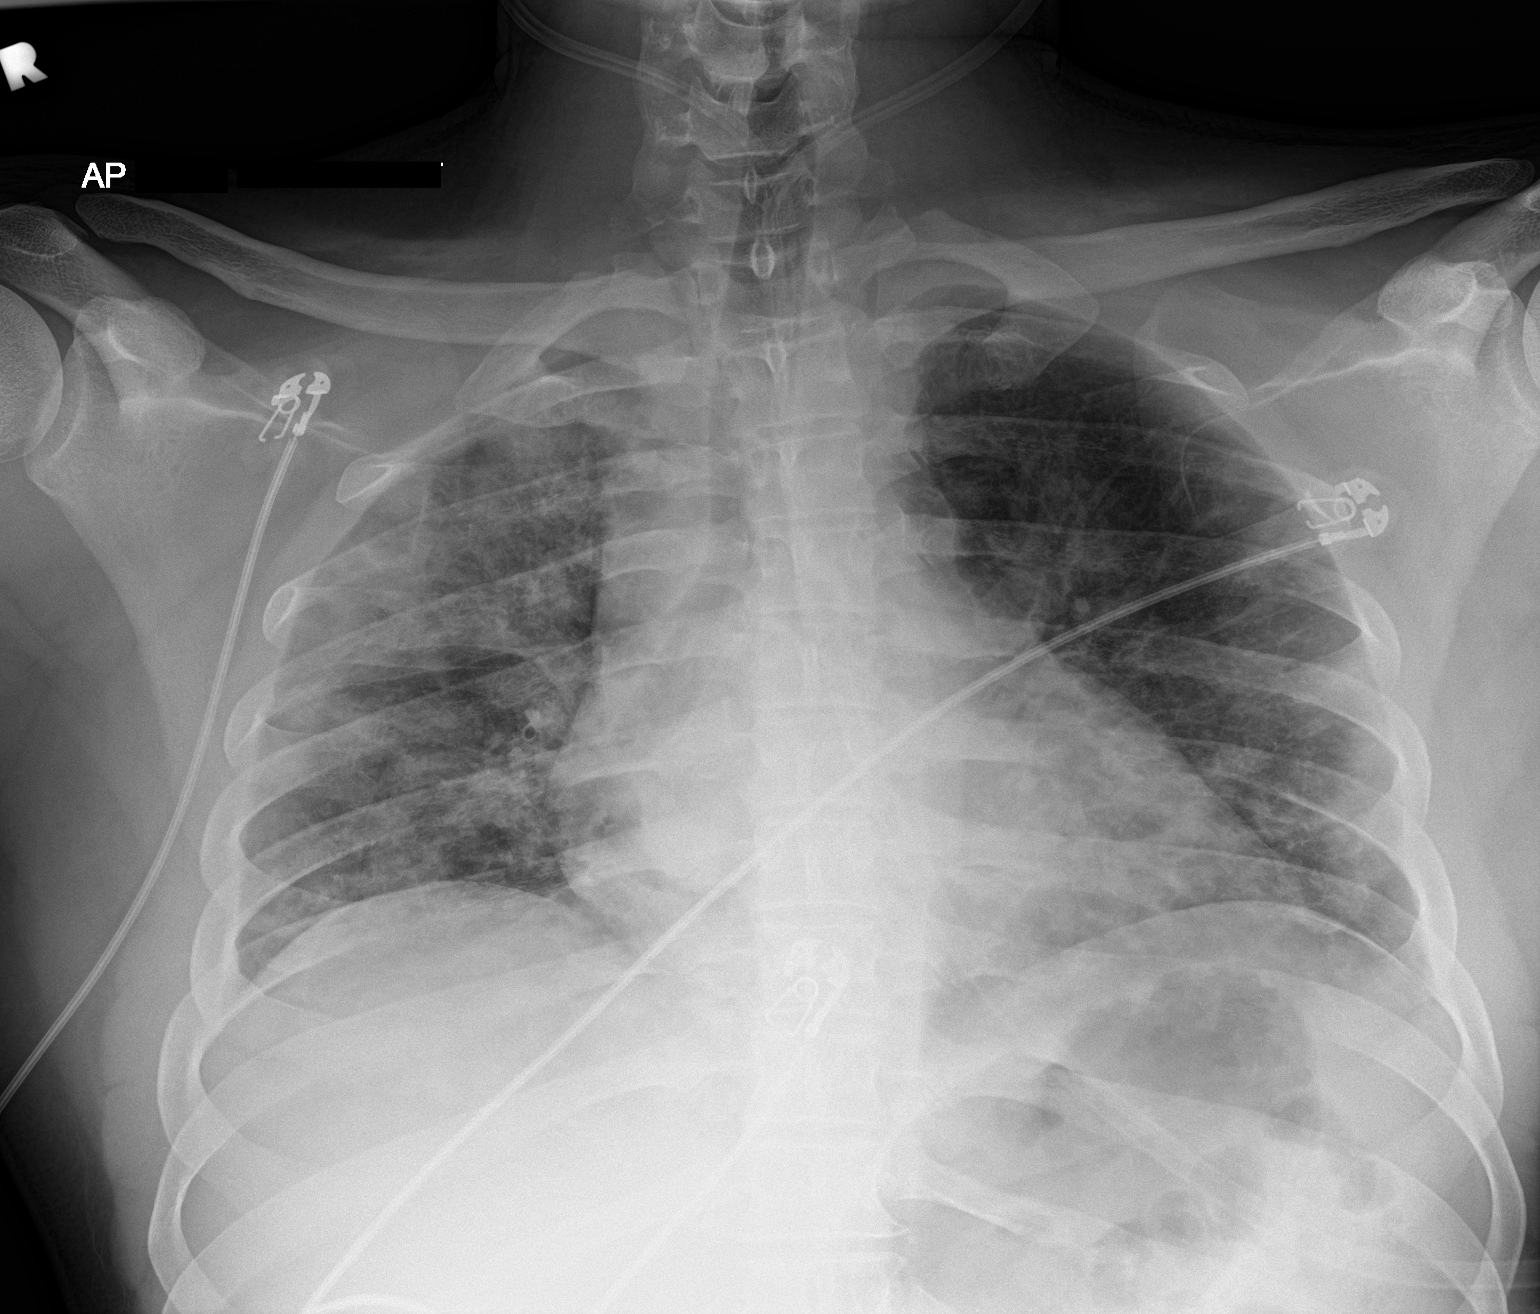

[1 of 1 positions shown; findings below may reference images not displayed]

FINDINGS: Cardiomediastinal silhouette unchanged in size and contour. Low lung
volumes persist. Persistent reticulonodular opacities of the lungs
with improvement in aeration compared to the prior plain film. No
pneumothorax or pleural effusion.
IMPRESSION: Persisting bilateral reticulonodular opacities, with improved
aeration compared to the prior plain film.
